# Patient Record
Sex: Female | Born: 1985 | Race: White | Hispanic: No | Marital: Married | Smoking: Never smoker
Health system: Southern US, Community
[De-identification: ages and names within clinical notes are randomized; demographics above are authoritative.]

## PROBLEM LIST (undated history)

## (undated) DIAGNOSIS — J45909 Unspecified asthma, uncomplicated: Secondary | ICD-10-CM

## (undated) HISTORY — DX: Unspecified asthma, uncomplicated: J45.909

---

## 2017-04-10 DIAGNOSIS — R490 Dysphonia: Secondary | ICD-10-CM | POA: Insufficient documentation

## 2017-04-10 DIAGNOSIS — J019 Acute sinusitis, unspecified: Secondary | ICD-10-CM | POA: Insufficient documentation

## 2017-05-07 DIAGNOSIS — K219 Gastro-esophageal reflux disease without esophagitis: Secondary | ICD-10-CM | POA: Insufficient documentation

## 2017-11-12 ENCOUNTER — Ambulatory Visit (INDEPENDENT_AMBULATORY_CARE_PROVIDER_SITE_OTHER): Payer: 59 | Admitting: Allergy and Immunology

## 2017-11-12 ENCOUNTER — Encounter: Payer: Self-pay | Admitting: Allergy and Immunology

## 2017-11-12 VITALS — BP 120/78 | HR 96 | Temp 97.9°F | Resp 20 | Ht 64.57 in | Wt 288.8 lb

## 2017-11-12 DIAGNOSIS — J4540 Moderate persistent asthma, uncomplicated: Secondary | ICD-10-CM | POA: Diagnosis not present

## 2017-11-12 DIAGNOSIS — H1010 Acute atopic conjunctivitis, unspecified eye: Secondary | ICD-10-CM | POA: Insufficient documentation

## 2017-11-12 DIAGNOSIS — H1013 Acute atopic conjunctivitis, bilateral: Secondary | ICD-10-CM

## 2017-11-12 DIAGNOSIS — J302 Other seasonal allergic rhinitis: Secondary | ICD-10-CM | POA: Insufficient documentation

## 2017-11-12 DIAGNOSIS — J3089 Other allergic rhinitis: Secondary | ICD-10-CM

## 2017-11-12 MED ORDER — OLOPATADINE HCL 0.2 % OP SOLN: 1.0000 [drp] | Freq: Every day | OPHTHALMIC | 5 refills | Status: DC | PRN

## 2017-11-12 MED ORDER — ALBUTEROL SULFATE HFA 108 (90 BASE) MCG/ACT IN AERS: 2.0000 | INHALATION_SPRAY | Freq: Four times a day (QID) | RESPIRATORY_TRACT | 2 refills | Status: DC | PRN

## 2017-11-12 MED ORDER — FLUTICASONE-SALMETEROL 230-21 MCG/ACT IN AERO: 2.0000 | INHALATION_SPRAY | Freq: Two times a day (BID) | RESPIRATORY_TRACT | 5 refills | Status: DC

## 2017-11-12 MED ORDER — AZELASTINE HCL 0.15 % NA SOLN: 1.0000 | Freq: Two times a day (BID) | NASAL | 5 refills | Status: DC | PRN

## 2017-11-12 MED ORDER — TIOTROPIUM BROMIDE MONOHYDRATE 1.25 MCG/ACT IN AERS: 2.0000 | INHALATION_SPRAY | Freq: Every day | RESPIRATORY_TRACT | 5 refills | Status: DC

## 2017-11-12 MED ORDER — CARBINOXAMINE MALEATE 4 MG PO TABS: 4.0000 mg | ORAL_TABLET | Freq: Three times a day (TID) | ORAL | 3 refills | Status: DC | PRN

## 2017-11-12 NOTE — Assessment & Plan Note (Signed)
   Aeroallergen avoidance measures have been discussed and provided in written form.  A prescription has been provided for carbinoxamine, 4 mg every 6-8 hours if needed.  A prescription has been provided for azelastine nasal spray, 1-2 sprays per nostril 2 times daily as needed. Proper nasal spray technique has been discussed and demonstrated.   Nasal saline spray (i.e., Simply Saline) or nasal saline lavage (i.e., NeilMed) is recommended as needed and prior to medicated nasal sprays.  If allergen avoidance measures and medications fail to adequately relieve symptoms, aeroallergen immunotherapy will be considered.

## 2017-11-12 NOTE — Progress Notes (Signed)
New Patient Note  RE: Deborah Owen MRN: [id number] DOB: 1985-05-12 Date of Office Visit: 11/12/2017  Referring provider: Charlesetta Shanks, MD Primary care provider: Darrow Bussing, MD  Chief Complaint: Asthma and Allergic Rhinitis    History of present illness: Deborah Owen is a 32 y.o. female seen today in consultation requested by Charlesetta Shanks, MD.  She reports that she moved from [place] to [place] when she was 32 years old.  When she was 32 years old she was diagnosed with asthma.  Over the past several years, her asthma has been controlled with Advair 100/50, 1 inhalation twice daily.  However, she had an asthma exacerbation in July requiring evaluation and treatment at the local urgent care.  She was given a steroid injection.  She returned to urgent care in August with another asthma exacerbation and was given a prednisone taper and a prescription for Advair 250/50, 1 inhalation twice daily.  She reports that she was on montelukast in the past but discontinued for unclear reasons.  Her asthma symptoms consist of coughing, dyspnea, and wheezing.  She currently requires albuterol rescue once daily on average. She also experiences nasal congestion, thick postnasal drainage, throat clearing, occasional sinus pressure, and occasional ocular pruritus.  No significant seasonal symptom variation has been noted nor have specific environmental triggers been identified.  She has attempted to control these symptoms with over-the-counter antihistamines without perceived benefit.  Her upper and lower respiratory symptoms are triggered by smoke, perfume, cats, and dogs.  Assessment and plan: Moderate persistent asthma Currently with suboptimal control.  We will step up therapy at this time.  A prescription has been provided for Advair 230-21 g HFA, 2 inhalations twice daily.  To maximize pulmonary deposition, a spacer has been provided along with instructions for its proper administration with an  HFA inhaler.  A prescription has been provided for Spiriva Respimat 1.25 g, 2 inhalations daily.  Continue albuterol HFA, 1 to 2 inhalations every 4-6 hours as needed.  Subjective and objective measures of pulmonary function will be followed and the treatment plan will be adjusted accordingly.  Perennial and seasonal allergic rhinitis  Aeroallergen avoidance measures have been discussed and provided in written form.  A prescription has been provided for carbinoxamine, 4 mg every 6-8 hours if needed.  A prescription has been provided for azelastine nasal spray, 1-2 sprays per nostril 2 times daily as needed. Proper nasal spray technique has been discussed and demonstrated.   Nasal saline spray (i.e., Simply Saline) or nasal saline lavage (i.e., NeilMed) is recommended as needed and prior to medicated nasal sprays.  If allergen avoidance measures and medications fail to adequately relieve symptoms, aeroallergen immunotherapy will be considered.  Allergic conjunctivitis  Treatment plan as outlined above for allergic rhinitis.  A prescription has been provided for Pataday, one drop per eye daily as needed.  I have also recommended eye lubricant drops (i.e., Natural Tears) as needed.   Meds ordered this encounter  Medications  . albuterol (PROVENTIL HFA;VENTOLIN HFA) 108 (90 Base) MCG/ACT inhaler    Sig: Inhale 2 puffs into the lungs every 6 (six) hours as needed for wheezing or shortness of breath.    Dispense:  18 g    Refill:  2  . fluticasone-salmeterol (ADVAIR HFA) 230-21 MCG/ACT inhaler    Sig: Inhale 2 puffs into the lungs 2 (two) times daily.    Dispense:  12 g    Refill:  5  . Tiotropium Bromide Monohydrate (SPIRIVA RESPIMAT) 1.25  MCG/ACT AERS    Sig: Inhale 2 puffs into the lungs daily.    Dispense:  4 g    Refill:  5  . Carbinoxamine Maleate 4 MG TABS    Sig: Take 1 tablet (4 mg total) by mouth every 8 (eight) hours as needed.    Dispense:  30 each    Refill:  3    . Azelastine HCl 0.15 % SOLN    Sig: Place 1-2 sprays into both nostrils 2 (two) times daily as needed.    Dispense:  30 mL    Refill:  5  . Olopatadine HCl (PATADAY) 0.2 % SOLN    Sig: Place 1 drop into both eyes daily as needed.    Dispense:  1 Bottle    Refill:  5    Diagnostics: Spirometry: FVC was 3.65 L and FEV1 was 3.43 L with 190 mL postbronchodilator improvement.  This study was performed while the patient was asymptomatic.  Please see scanned spirometry results for details. Environmental skin testing: Positive to grass pollen, dog epithelia, and cat hair.    Physical examination: Blood pressure 120/78, pulse 96, temperature 97.9 F (36.6 C), temperature source Oral, resp. rate 20, height 5' 4.57" (1.64 m), weight 288 lb 12.8 oz (131 kg), SpO2 97 %.  General: Alert, interactive, in no acute distress. HEENT: TMs pearly gray, turbinates moderately edematous with thick discharge, post-pharynx moderately erythematous. Neck: Supple without lymphadenopathy. Lungs: Clear to auscultation without wheezing, rhonchi or rales. CV: Normal S1, S2 without murmurs. Abdomen: Nondistended, nontender. Skin: Warm and dry, without lesions or rashes. Extremities:  No clubbing, cyanosis or edema. Neuro:   Grossly intact.  Review of systems:  Review of systems negative except as noted in HPI / PMHx or noted below: Review of Systems  Constitutional: Negative.   HENT: Negative.   Eyes: Negative.   Respiratory: Negative.   Cardiovascular: Negative.   Gastrointestinal: Negative.   Genitourinary: Negative.   Musculoskeletal: Negative.   Skin: Negative.   Neurological: Negative.   Endo/Heme/Allergies: Negative.   Psychiatric/Behavioral: Negative.     Past medical history:  Past Medical History:  Diagnosis Date  . Asthma     Past surgical history:  History reviewed. No pertinent surgical history.  Family history: Family History  Problem Relation Age of Onset  . Allergic rhinitis  Brother   . Asthma Neg Hx   . Eczema Neg Hx   . Urticaria Neg Hx     Social history: Social History   Socioeconomic History  . Marital status: Married    Spouse name: Not on file  . Number of children: Not on file  . Years of education: Not on file  . Highest education level: Not on file  Occupational History  . Not on file  Social Needs  . Financial resource strain: Not on file  . Food insecurity:    Worry: Not on file    Inability: Not on file  . Transportation needs:    Medical: Not on file    Non-medical: Not on file  Tobacco Use  . Smoking status: Never Smoker  . Smokeless tobacco: Never Used  Substance and Sexual Activity  . Alcohol use: Never    Frequency: Never  . Drug use: Not on file  . Sexual activity: Not on file  Lifestyle  . Physical activity:    Days per week: Not on file    Minutes per session: Not on file  . Stress: Not on file  Relationships  .  Social connections:    Talks on phone: Not on file    Gets together: Not on file    Attends religious service: Not on file    Active member of club or organization: Not on file    Attends meetings of clubs or organizations: Not on file    Relationship status: Not on file  . Intimate partner violence:    Fear of current or ex partner: Not on file    Emotionally abused: Not on file    Physically abused: Not on file    Forced sexual activity: Not on file  Other Topics Concern  . Not on file  Social History Narrative  . Not on file   Environmental History: The patient lives in a 30 year old apartment with carpeting throughout, gas heat, and central air.  There is a cat in the home which has access to her bedroom.  There is no known mold/water damage in the home.  She is a non-smoker.  Allergies as of 11/12/2017   No Known Allergies     Medication List        Accurate as of 11/12/17  6:35 PM. Always use your most recent med list.          albuterol 108 (90 Base) MCG/ACT inhaler Commonly known  as:  PROVENTIL HFA;VENTOLIN HFA Inhale 2 puffs into the lungs every 6 (six) hours as needed for wheezing or shortness of breath.   Azelastine HCl 0.15 % Soln Place 1-2 sprays into both nostrils 2 (two) times daily as needed.   Carbinoxamine Maleate 4 MG Tabs Take 1 tablet (4 mg total) by mouth every 8 (eight) hours as needed.   Fluticasone-Salmeterol 250-50 MCG/DOSE Aepb Commonly known as:  ADVAIR Inhale 2 puffs into the lungs 2 (two) times daily as needed.   fluticasone-salmeterol 230-21 MCG/ACT inhaler Commonly known as:  ADVAIR HFA Inhale 2 puffs into the lungs 2 (two) times daily.   Olopatadine HCl 0.2 % Soln Place 1 drop into both eyes daily as needed.   Tiotropium Bromide Monohydrate 1.25 MCG/ACT Aers Inhale 2 puffs into the lungs daily.       Known medication allergies: No Known Allergies  I appreciate the opportunity to take part in Raymond's care. Please do not hesitate to contact me with questions.  Sincerely,   R. Jorene Guest, MD

## 2017-11-12 NOTE — Assessment & Plan Note (Signed)
   Treatment plan as outlined above for allergic rhinitis.  A prescription has been provided for Pataday, one drop per eye daily as needed.  I have also recommended eye lubricant drops (i.e., Natural Tears) as needed. 

## 2017-11-12 NOTE — Patient Instructions (Addendum)
Moderate persistent asthma Currently with suboptimal control.  We will step up therapy at this time.  A prescription has been provided for Advair 230-21 g HFA, 2 inhalations twice daily.  To maximize pulmonary deposition, a spacer has been provided along with instructions for its proper administration with an HFA inhaler.  A prescription has been provided for Spiriva Respimat 1.25 g, 2 inhalations daily.  Continue albuterol HFA, 1 to 2 inhalations every 4-6 hours as needed.  Subjective and objective measures of pulmonary function will be followed and the treatment plan will be adjusted accordingly.  Perennial and seasonal allergic rhinitis  Aeroallergen avoidance measures have been discussed and provided in written form.  A prescription has been provided for carbinoxamine, 4 mg every 6-8 hours if needed.  A prescription has been provided for azelastine nasal spray, 1-2 sprays per nostril 2 times daily as needed. Proper nasal spray technique has been discussed and demonstrated.   Nasal saline spray (i.e., Simply Saline) or nasal saline lavage (i.e., NeilMed) is recommended as needed and prior to medicated nasal sprays.  If allergen avoidance measures and medications fail to adequately relieve symptoms, aeroallergen immunotherapy will be considered.  Allergic conjunctivitis  Treatment plan as outlined above for allergic rhinitis.  A prescription has been provided for Pataday, one drop per eye daily as needed.  I have also recommended eye lubricant drops (i.e., Natural Tears) as needed.   Return in about 3 months (around 02/12/2018), or if symptoms worsen or fail to improve.  Control of Dog or Cat Allergen  Avoidance is the best way to manage a dog or cat allergy. If you have a dog or cat and are allergic to dog or cats, consider removing the dog or cat from the home. If you have a dog or cat but don't want to find it a new home, or if your family wants a pet even though someone  in the household is allergic, here are some strategies that may help keep symptoms at bay:  1. Keep the pet out of your bedroom and restrict it to only a few rooms. Be advised that keeping the dog or cat in only one room will not limit the allergens to that room. 2. Don't pet, hug or kiss the dog or cat; if you do, wash your hands with soap and water. 3. High-efficiency particulate air (HEPA) cleaners run continuously in a bedroom or living room can reduce allergen levels over time. 4. Place electrostatic material sheet in the air inlet vent in the bedroom. 5. Regular use of a high-efficiency vacuum cleaner or a central vacuum can reduce allergen levels. 6. Giving your dog or cat a bath at least once a week can reduce airborne allergen.  Control of Mold Allergen  Mold and fungi can grow on a variety of surfaces provided certain temperature and moisture conditions exist.  Outdoor molds grow on plants, decaying vegetation and soil.  The major outdoor mold, Alternaria and Cladosporium, are found in very high numbers during hot and dry conditions.  Generally, a late Summer - Fall peak is seen for common outdoor fungal spores.  Rain will temporarily lower outdoor mold spore count, but counts rise rapidly when the rainy period ends.  The most important indoor molds are Aspergillus and Penicillium.  Dark, humid and poorly ventilated basements are ideal sites for mold growth.  The next most common sites of mold growth are the bathroom and the kitchen.  Outdoor [employer] 1. Use air conditioning and keep windows  closed 2. Avoid exposure to decaying vegetation. 3. Avoid leaf raking. 4. Avoid grain handling. 5. Consider wearing a face mask if working in moldy areas.  Indoor Mold Control 1. Maintain humidity below 50%. 2. Clean washable surfaces with 5% bleach solution. 3. Remove sources e.g. Contaminated carpets.

## 2017-11-12 NOTE — Assessment & Plan Note (Signed)
Currently with suboptimal control.  We will step up therapy at this time.  A prescription has been provided for Advair 230-21 g HFA, 2 inhalations twice daily.  To maximize pulmonary deposition, a spacer has been provided along with instructions for its proper administration with an HFA inhaler.  A prescription has been provided for Spiriva Respimat 1.25 g, 2 inhalations daily.  Continue albuterol HFA, 1 to 2 inhalations every 4-6 hours as needed.  Subjective and objective measures of pulmonary function will be followed and the treatment plan will be adjusted accordingly.

## 2018-02-17 ENCOUNTER — Ambulatory Visit (INDEPENDENT_AMBULATORY_CARE_PROVIDER_SITE_OTHER): Payer: 59 | Admitting: Family Medicine

## 2018-02-17 ENCOUNTER — Encounter: Payer: Self-pay | Admitting: Family Medicine

## 2018-02-17 VITALS — BP 130/82 | HR 80 | Temp 98.2°F | Resp 18 | Ht 64.0 in | Wt 295.6 lb

## 2018-02-17 DIAGNOSIS — J3089 Other allergic rhinitis: Secondary | ICD-10-CM | POA: Diagnosis not present

## 2018-02-17 DIAGNOSIS — J4540 Moderate persistent asthma, uncomplicated: Secondary | ICD-10-CM

## 2018-02-17 DIAGNOSIS — H1013 Acute atopic conjunctivitis, bilateral: Secondary | ICD-10-CM | POA: Diagnosis not present

## 2018-02-17 MED ORDER — CARBINOXAMINE MALEATE 4 MG PO TABS: ORAL_TABLET | ORAL | 3 refills | Status: DC

## 2018-02-17 NOTE — Patient Instructions (Addendum)
Moderate persistent asthma Continue Advair 230/21 -2 puffs twice a day with a spacer to prevent cough and wheeze Continue Spiriva 1.25 -2 puffs once a day to prevent cough and wheeze Continue Proventil inhaler 2 puffs every 4 hours as needed for cough or wheeze You may use Proventil 2 puffs 5-15 minutes before exercise to decrease cough and wheeze  Perennial and seasonal allergic rhinitis Continue carbinoxamine 4 mg but increase to two or three times a day to help with thick mucus Continue azelastine nasal spray as needed Consider nasal rinses once a day  Allergic conjunctivitis of both eyes Continue Allergen avoidance measures Pataday 1 drop in each eye once a day as needed for red or itchy eyes  Call us if this treatment plan is not working well for you  Follow up in 4 months or sooner if needed

## 2018-02-17 NOTE — Progress Notes (Addendum)
[address] [place] [address] Dept: [phone]  FOLLOW UP NOTE  Patient ID: Deborah Owen, female    DOB: 1985/07/02  Age: 33 y.o. MRN: [id number] Date of Office Visit: 02/17/2018  Assessment  Chief Complaint: Asthma (more under control) and Cough  HPI Deborah Owen is a 33 year old female who presents to the clinic for a follow up visit. She was last seen in this clinic on 11/12/2017 by Dr. Nunzio Cobbs for evaluation of asthma, allergic rhinitis, and allergic conjunctivitis. At that time, she started in Advair 230/21 2 puffs twice a day and carbinoxamine. At today's visit, she reports her asthma has been moderately well controlled with occasional shortness of breath and wheezing with activity. She reports a cough that always produces mucus that is mostly thick and clear. She continues Advair 230/21 2 puffs twice a day and she has been using Spiriva 1.25 1 puff in the morning and 1 puff at night due to a misunderstanding of prior instructions. She reports allergic rhinitis is improved, however, she continues to experience thick post nasal drainage as well as thin clear nasal drip. She is taking carbinoxamine as needed and has used azelastine once since her last visit with relief of symptoms. Her current medications are listed in the chart.    Drug Allergies:  No Known Allergies  Physical Exam: BP 130/82 (BP Location: Right Arm, Patient Position: Sitting, Cuff Size: Large)   Pulse 80   Temp 98.2 F (36.8 C) (Oral)   Resp 18   Ht 5\' 4"  (1.626 m)   Wt 295 lb 9.6 oz (134.1 kg)   SpO2 96%   BMI 50.74 kg/m    Physical Exam Vitals signs reviewed.  Constitutional:      Appearance: Normal appearance.  HENT:     Head: Normocephalic and atraumatic.     Right Ear: Tympanic membrane normal.     Left Ear: Tympanic membrane normal.     Nose:     Comments: Bilateral nares slightly erythematous with no nasal drainage. Pharynx slightly erythematous with no exudate. Ears normal. Eyes  normal.    Mouth/Throat:     Pharynx: Oropharynx is clear.  Eyes:     Conjunctiva/sclera: Conjunctivae normal.  Neck:     Musculoskeletal: Normal range of motion and neck supple.  Cardiovascular:     Rate and Rhythm: Normal rate and regular rhythm.     Heart sounds: Normal heart sounds. No murmur.  Pulmonary:     Effort: Pulmonary effort is normal.     Breath sounds: Normal breath sounds.     Comments: Lungs clear to auscultation Musculoskeletal: Normal range of motion.  Skin:    General: Skin is warm and dry.  Neurological:     Mental Status: She is alert and oriented to person, place, and time.  Psychiatric:        Mood and Affect: Mood normal.        Behavior: Behavior normal.        Thought Content: Thought content normal.        Judgment: Judgment normal.     Diagnostics: FVC 3.62, FEV1 3.32. Predicted FVC 3.80, predicted FEV1 3.20. Spirometry is within the normal range.  Assessment and Plan: 1. Moderate persistent asthma without complication   2. Perennial and seasonal allergic rhinitis   3. Allergic conjunctivitis of both eyes     Meds ordered this encounter  Medications  . Carbinoxamine Maleate 4 MG TABS    Sig: One tablet (4mg )  two or three times a day as needed.    Dispense:  60 each    Refill:  3    Patient Instructions  Moderate persistent asthma Continue Advair 230/21 -2 puffs twice a day with a spacer to prevent cough and wheeze Continue Spiriva 1.25 -2 puffs once a day to prevent cough and wheeze Continue Proventil inhaler 2 puffs every 4 hours as needed for cough or wheeze You may use Proventil 2 puffs 5-15 minutes before exercise to decrease cough and wheeze  Perennial and seasonal allergic rhinitis Continue carbinoxamine 4 mg but increase to two or three times a day to help with thick mucus Continue azelastine nasal spray as needed Consider nasal rinses once a day  Allergic conjunctivitis of both eyes Continue Allergen avoidance  measures Pataday 1 drop in each eye once a day as needed for red or itchy eyes  Call us if this treatment plan is not working well for you  Follow up in 4 months or sooner if needed   Return in about 4 months (around 06/18/2018), or if symptoms worsen or fail to improve.   Thank you for the opportunity to care for this patient.  Please do not hesitate to contact me with questions.  Thermon LeylandAnne Abhijay Morriss, FNP [facility]   _________________________________________________  I have provided oversight concerning [facility]'s evaluation and treatment of this patient's health issues addressed during today's encounter.  I agree with the assessment and therapeutic plan as outlined in the note.   Signed,   R Jorene Guestarter Bobbitt, MD

## 2018-04-26 ENCOUNTER — Telehealth: Payer: Self-pay

## 2018-04-26 NOTE — Telephone Encounter (Signed)
Pt would like a note stating because of her asthma she should be able to participate in early shopping hours for elderly and high risk people. If it is ok what should it state.  pts # [phone]

## 2018-04-26 NOTE — Telephone Encounter (Signed)
Informed pt per johnette we are not witting notes to be excused from anything and that she should consider online grocery shopping pt stated her understanding

## 2018-04-26 NOTE — Telephone Encounter (Signed)
Please say that due to severe asthma she is allowed to shop during hours that have been dedicated to elderly and high risk patients. Thank you

## 2018-05-18 ENCOUNTER — Telehealth: Payer: Self-pay | Admitting: Allergy and Immunology

## 2018-05-18 ENCOUNTER — Other Ambulatory Visit: Payer: Self-pay

## 2018-05-18 MED ORDER — ALBUTEROL SULFATE HFA 108 (90 BASE) MCG/ACT IN AERS: 2.0000 | INHALATION_SPRAY | Freq: Four times a day (QID) | RESPIRATORY_TRACT | 2 refills | Status: DC | PRN

## 2018-05-18 MED ORDER — FLUTICASONE-SALMETEROL 230-21 MCG/ACT IN AERO: 2.0000 | INHALATION_SPRAY | Freq: Two times a day (BID) | RESPIRATORY_TRACT | 5 refills | Status: DC

## 2018-05-18 NOTE — Telephone Encounter (Signed)
Sent in refill

## 2018-05-18 NOTE — Telephone Encounter (Signed)
PT called in to reorder her inhalers & Albuterol. Pharmacy is the same.

## 2018-06-14 ENCOUNTER — Other Ambulatory Visit: Payer: Self-pay | Admitting: *Deleted

## 2018-06-14 NOTE — Telephone Encounter (Signed)
Denied spirva respimat- pt last seen 11/2017 and was to return 02/2018.

## 2018-06-15 ENCOUNTER — Other Ambulatory Visit: Payer: Self-pay

## 2018-06-15 MED ORDER — TIOTROPIUM BROMIDE MONOHYDRATE 1.25 MCG/ACT IN AERS: 2.0000 | INHALATION_SPRAY | Freq: Every day | RESPIRATORY_TRACT | 1 refills | Status: DC

## 2018-06-23 ENCOUNTER — Ambulatory Visit: Payer: 59 | Admitting: Allergy and Immunology

## 2018-08-19 ENCOUNTER — Ambulatory Visit: Payer: 59 | Admitting: Allergy and Immunology

## 2018-08-26 ENCOUNTER — Other Ambulatory Visit: Payer: Self-pay | Admitting: Allergy and Immunology

## 2018-08-26 NOTE — Telephone Encounter (Signed)
GAVE 1 COURTESY REFILL OF ALBUTEROL. PT NEEDS OV.

## 2018-08-27 ENCOUNTER — Other Ambulatory Visit: Payer: Self-pay | Admitting: Allergy and Immunology

## 2018-10-14 ENCOUNTER — Other Ambulatory Visit: Payer: Self-pay | Admitting: *Deleted

## 2018-10-18 ENCOUNTER — Other Ambulatory Visit: Payer: Self-pay | Admitting: *Deleted

## 2018-11-01 ENCOUNTER — Other Ambulatory Visit: Payer: Self-pay | Admitting: *Deleted

## 2018-11-03 ENCOUNTER — Other Ambulatory Visit: Payer: Self-pay

## 2018-11-10 ENCOUNTER — Other Ambulatory Visit: Payer: Self-pay | Admitting: Allergy and Immunology

## 2018-11-10 DIAGNOSIS — J4540 Moderate persistent asthma, uncomplicated: Secondary | ICD-10-CM

## 2018-11-25 ENCOUNTER — Other Ambulatory Visit: Payer: Self-pay | Admitting: *Deleted

## 2018-11-25 MED ORDER — ALBUTEROL SULFATE HFA 108 (90 BASE) MCG/ACT IN AERS: 2.0000 | INHALATION_SPRAY | RESPIRATORY_TRACT | 0 refills | Status: DC | PRN

## 2018-11-25 MED ORDER — SPIRIVA RESPIMAT 1.25 MCG/ACT IN AERS: 2.0000 | INHALATION_SPRAY | Freq: Every day | RESPIRATORY_TRACT | 0 refills | Status: DC

## 2018-11-29 ENCOUNTER — Ambulatory Visit (INDEPENDENT_AMBULATORY_CARE_PROVIDER_SITE_OTHER): Payer: 59 | Admitting: Family Medicine

## 2018-11-29 ENCOUNTER — Other Ambulatory Visit: Payer: Self-pay

## 2018-11-29 ENCOUNTER — Encounter: Payer: Self-pay | Admitting: Family Medicine

## 2018-11-29 DIAGNOSIS — J3089 Other allergic rhinitis: Secondary | ICD-10-CM | POA: Diagnosis not present

## 2018-11-29 DIAGNOSIS — J4540 Moderate persistent asthma, uncomplicated: Secondary | ICD-10-CM | POA: Diagnosis not present

## 2018-11-29 DIAGNOSIS — H1013 Acute atopic conjunctivitis, bilateral: Secondary | ICD-10-CM

## 2018-11-29 MED ORDER — CARBINOXAMINE MALEATE 4 MG PO TABS: ORAL_TABLET | ORAL | 3 refills | Status: DC

## 2018-11-29 MED ORDER — AZELASTINE HCL 0.15 % NA SOLN: 1.0000 | Freq: Two times a day (BID) | NASAL | 5 refills | Status: DC | PRN

## 2018-11-29 MED ORDER — SPIRIVA RESPIMAT 1.25 MCG/ACT IN AERS: 2.0000 | INHALATION_SPRAY | Freq: Every day | RESPIRATORY_TRACT | 5 refills | Status: DC

## 2018-11-29 MED ORDER — FLUTICASONE-SALMETEROL 230-21 MCG/ACT IN AERO: 2.0000 | INHALATION_SPRAY | Freq: Two times a day (BID) | RESPIRATORY_TRACT | 5 refills | Status: DC

## 2018-11-29 MED ORDER — MONTELUKAST SODIUM 10 MG PO TABS: 10.0000 mg | ORAL_TABLET | Freq: Every day | ORAL | 5 refills | Status: DC

## 2018-11-29 MED ORDER — ALBUTEROL SULFATE HFA 108 (90 BASE) MCG/ACT IN AERS: 2.0000 | INHALATION_SPRAY | RESPIRATORY_TRACT | 2 refills | Status: DC | PRN

## 2018-11-29 NOTE — Patient Instructions (Addendum)
Moderate persistent asthma Begin montelukast 10 mg once a day to prevent cough or wheeze Continue Advair 230/21 -2 puffs twice a day with a spacer to prevent cough and wheeze Continue Spiriva 1.25 -2 puffs once a day to prevent cough and wheeze Continue Proventil inhaler 2 puffs every 4 hours as needed for cough or wheeze You may use Proventil 2 puffs 5-15 minutes before exercise to decrease cough and wheeze  Perennial and seasonal allergic rhinitis Continue carbinoxamine 4 mg but increase to two or three times a day to help with thick mucus Continue azelastine nasal spray as needed Consider nasal rinses once a day  Allergic conjunctivitis of both eyes Continue Allergen avoidance measures Pataday 1 drop in each eye once a day as needed for red or itchy eyes  Call us if this treatment plan is not working well for you  Follow up in the clinic in 6 months or sooner if needed

## 2018-11-29 NOTE — Progress Notes (Addendum)
RE: Deborah Owen MRN: [id number] DOB: 1985/04/23 Date of Telemedicine Visit: 11/29/2018  Referring provider: Lujean Amel, MD Primary care provider: Lujean Amel, MD  Chief Complaint: Asthma   Telemedicine Follow Up Visit via Telephone: I connected with Deborah Owen for a follow up on 11/29/18 by telephone and verified that I am speaking with the correct person using two identifiers.   I discussed the limitations, risks, security and privacy concerns of performing an evaluation and management service by telephone and the availability of in person appointments. I also discussed with the patient that there may be a patient responsible charge related to this service. The patient expressed understanding and agreed to proceed.  Patient is at home Provider is at the office.  Visit start time: 1:50 Visit end time: 2:18 Insurance consent/check in by: Jan Fireman Medical consent and medical assistant/nurse: Katherina Right  History of Present Illness: She is a 33 y.o. female, who is being followed for asthma, allergic rhinitis, and allergic conjunctivitis. Her previous allergy office visit was on 02/17/2018 with Gareth Morgan, [place]. At today's visit, she reports her asthma has improved since her last visit to this clinic. She reports some shortness of breath and wheeze with vigorous activity. She continues Advair 230-2 puffs twice a day with a spacer, Spiriva 1.25 mcg-2 puffs once a day, and albuterol 2 times a week for rescue. She continues albuterol 2 puffs before activities. Allergic rhinitis is reported as well controlled with carbinoxamine and Flonase as needed. Allergic conjunctivitis is reported as well controlled with Pataday as needed. Her current medications are lsited in the chart.   Assessment and Plan: Moderate persistent asthma Begin montelukast 10 mg once a day to prevent cough or wheeze Continue Advair 230/21 -2 puffs twice a day with a spacer to prevent cough and wheeze Continue Spiriva  1.25 -2 puffs once a day to prevent cough and wheeze Continue Proventil inhaler 2 puffs every 4 hours as needed for cough or wheeze You may use Proventil 2 puffs 5-15 minutes before exercise to decrease cough and wheeze  Perennial and seasonal allergic rhinitis Continue carbinoxamine 4 mg but increase to two or three times a day to help with thick mucus Continue azelastine nasal spray as needed Consider nasal rinses once a day  Allergic conjunctivitis of both eyes Continue Allergen avoidance measures Pataday 1 drop in each eye once a day as needed for red or itchy eyes  Call us if this treatment plan is not working well for you  Follow up in the clinic in 6 months or sooner if needed  Return in about 6 months (around 05/30/2019), or if symptoms worsen or fail to improve.  Meds ordered this encounter  Medications  . montelukast (SINGULAIR) 10 MG tablet    Sig: Take 1 tablet (10 mg total) by mouth at bedtime.    Dispense:  30 tablet    Refill:  5  . albuterol (VENTOLIN HFA) 108 (90 Base) MCG/ACT inhaler    Sig: Inhale 2 puffs into the lungs every 4 (four) hours as needed for wheezing or shortness of breath.    Dispense:  6.7 g    Refill:  2    Place on hold- pt will call  . fluticasone-salmeterol (ADVAIR HFA) 230-21 MCG/ACT inhaler    Sig: Inhale 2 puffs into the lungs 2 (two) times daily.    Dispense:  12 g    Refill:  5  . Tiotropium Bromide Monohydrate (SPIRIVA RESPIMAT) 1.25 MCG/ACT AERS  Sig: Inhale 2 puffs into the lungs daily.    Dispense:  4 g    Refill:  5    Place on hold- pt will call  . Carbinoxamine Maleate 4 MG TABS    Sig: One tablet (4mg ) two or three times a day as needed.    Dispense:  180 tablet    Refill:  3  . Azelastine HCl 0.15 % SOLN    Sig: Place 1-2 sprays into both nostrils 2 (two) times daily as needed.    Dispense:  30 mL    Refill:  5    Medication List:  Current Outpatient Medications  Medication Sig Dispense Refill  . albuterol  (VENTOLIN HFA) 108 (90 Base) MCG/ACT inhaler Inhale 2 puffs into the lungs every 4 (four) hours as needed for wheezing or shortness of breath. 6.7 g 2  . Azelastine HCl 0.15 % SOLN Place 1-2 sprays into both nostrils 2 (two) times daily as needed. 30 mL 5  . Carbinoxamine Maleate 4 MG TABS One tablet (4mg ) two or three times a day as needed. 180 tablet 3  . fluticasone-salmeterol (ADVAIR HFA) 230-21 MCG/ACT inhaler Inhale 2 puffs into the lungs 2 (two) times daily. 12 g 5  . Tiotropium Bromide Monohydrate (SPIRIVA RESPIMAT) 1.25 MCG/ACT AERS Inhale 2 puffs into the lungs daily. 4 g 5  . montelukast (SINGULAIR) 10 MG tablet Take 1 tablet (10 mg total) by mouth at bedtime. 30 tablet 5  . Olopatadine HCl (PATADAY) 0.2 % SOLN Place 1 drop into both eyes daily as needed. (Patient not taking: Reported on 11/29/2018) 1 Bottle 5   No current facility-administered medications for this visit.    Allergies: No Known Allergies I reviewed her past medical history, social history, family history, and environmental history and no significant changes have been reported from previous visit on 02/17/2018.  Objective: Physical Exam Not obtained as encounter was done via telephone.   Previous notes and tests were reviewed.  I discussed the assessment and treatment plan with the patient. The patient was provided an opportunity to ask questions and all were answered. The patient agreed with the plan and demonstrated an understanding of the instructions.   The patient was advised to call back or seek an in-person evaluation if the symptoms worsen or if the condition fails to improve as anticipated.  I provided 28 minutes of non-face-to-face time during this encounter.  It was my pleasure to participate in Deborah Owen's care today. Please feel free to contact me with any questions or concerns.   Sincerely,  02/19/2018, FNP   I have provided oversight concerning Monday' evaluation and treatment of this  patient's health issues addressed during today's encounter. I agree with the assessment and therapeutic plan as outlined in the note.   Thank you for the opportunity to care for this patient.  Please do not hesitate to contact me with questions.  Thermon Leyland, M.D.  [facility] [address] [place], [address] [place] [phone]

## 2019-05-10 ENCOUNTER — Telehealth: Payer: Self-pay | Admitting: Family Medicine

## 2019-05-10 MED ORDER — SPIRIVA RESPIMAT 1.25 MCG/ACT IN AERS: 2.0000 | INHALATION_SPRAY | Freq: Every day | RESPIRATORY_TRACT | 0 refills | Status: DC

## 2019-05-10 MED ORDER — ALBUTEROL SULFATE HFA 108 (90 BASE) MCG/ACT IN AERS: 2.0000 | INHALATION_SPRAY | RESPIRATORY_TRACT | 0 refills | Status: DC | PRN

## 2019-05-10 NOTE — Telephone Encounter (Signed)
Called pt to reschedule, Thurston Hole is out on original appt. Date. Rescheduled appt. to May 20 at 9 a.m. Pt asked for refills of Spiriva and Albuterol. Advised patient needs to keep appt. Please send 30 day supply.

## 2019-05-10 NOTE — Telephone Encounter (Signed)
Sent in refills 

## 2019-05-30 ENCOUNTER — Ambulatory Visit: Payer: Self-pay | Admitting: Family Medicine

## 2019-06-23 ENCOUNTER — Ambulatory Visit (INDEPENDENT_AMBULATORY_CARE_PROVIDER_SITE_OTHER): Payer: 59 | Admitting: Family Medicine

## 2019-06-23 ENCOUNTER — Encounter: Payer: Self-pay | Admitting: Family Medicine

## 2019-06-23 VITALS — BP 122/74 | HR 82 | Temp 97.9°F | Resp 18 | Ht 64.5 in | Wt 303.0 lb

## 2019-06-23 DIAGNOSIS — H1013 Acute atopic conjunctivitis, bilateral: Secondary | ICD-10-CM

## 2019-06-23 DIAGNOSIS — J4540 Moderate persistent asthma, uncomplicated: Secondary | ICD-10-CM | POA: Diagnosis not present

## 2019-06-23 DIAGNOSIS — K219 Gastro-esophageal reflux disease without esophagitis: Secondary | ICD-10-CM

## 2019-06-23 DIAGNOSIS — J3089 Other allergic rhinitis: Secondary | ICD-10-CM | POA: Diagnosis not present

## 2019-06-23 MED ORDER — ALBUTEROL SULFATE HFA 108 (90 BASE) MCG/ACT IN AERS: 2.0000 | INHALATION_SPRAY | RESPIRATORY_TRACT | 1 refills | Status: DC | PRN

## 2019-06-23 MED ORDER — CARBINOXAMINE MALEATE 4 MG PO TABS: ORAL_TABLET | ORAL | 5 refills | Status: DC

## 2019-06-23 MED ORDER — AZELASTINE HCL 0.15 % NA SOLN: 1.0000 | Freq: Two times a day (BID) | NASAL | 5 refills | Status: DC | PRN

## 2019-06-23 MED ORDER — SPIRIVA RESPIMAT 1.25 MCG/ACT IN AERS: 2.0000 | INHALATION_SPRAY | Freq: Every day | RESPIRATORY_TRACT | 5 refills | Status: DC

## 2019-06-23 MED ORDER — FLUTICASONE-SALMETEROL 230-21 MCG/ACT IN AERO: 2.0000 | INHALATION_SPRAY | Freq: Two times a day (BID) | RESPIRATORY_TRACT | 5 refills | Status: DC

## 2019-06-23 NOTE — Progress Notes (Addendum)
[address] [place] [address] Dept: [phone]  FOLLOW UP NOTE  Patient ID: Deborah Owen, female    DOB: 11-19-1985  Age: 34 y.o. MRN: [id number] Date of Office Visit: 06/23/2019  Assessment  Chief Complaint: Cough  HPI Deborah Owen is a 34 year old female who presents to the clinic for follow-up visit.  She was last seen in this clinic on 11/19/2019 for evaluation of asthma, allergic rhinitis, and allergic conjunctivitis.  At today's visit, she reports her asthma has been well controlled with no shortness of breath with activity or rest.  She reports occasional wheeze when she begins coughing.  She continues montelukast, Advair 230-2 puffs twice a day with a spacer, and Spiriva 2 puffs once a day.  She uses albuterol before exercise.  She reports interest in stopping montelukast as this has not improved her symptoms.  Allergic rhinitis is reported as not well controlled thick postnasal drainage, clear mucus producing cough occurring randomly in the daytime and nighttime, and cough occurring after strong odors.  She continues carbinoxamine as needed, azelastine as needed, and is not currently using nasal saline rinses.  Reflux is reported as moderately well controlled and occurring after eating foods that are greasy for which she takes Tums with relief of symptoms.  Her current medications are listed in the chart.   Drug Allergies:  No Known Allergies  Physical Exam: BP 122/74   Pulse 82   Temp 97.9 F (36.6 C) (Oral)   Resp 18   Ht 5' 4.5" (1.638 m)   Wt (!) 303 lb (137.4 kg)   SpO2 98%   BMI 51.21 kg/m    Physical Exam Vitals reviewed.  Constitutional:      Appearance: Normal appearance.  HENT:     Head: Normocephalic and atraumatic.     Right Ear: Tympanic membrane normal.     Left Ear: Tympanic membrane normal.     Nose:     Comments: Bilateral nares slightly erythematous with clear nasal drainage.  Pharynx slightly erythematous with no exudate.  Ears normal.   Eyes normal.    Mouth/Throat:     Pharynx: Oropharynx is clear.  Eyes:     Conjunctiva/sclera: Conjunctivae normal.  Cardiovascular:     Rate and Rhythm: Normal rate and regular rhythm.     Heart sounds: Normal heart sounds. No murmur.  Pulmonary:     Effort: Pulmonary effort is normal.     Breath sounds: Normal breath sounds.     Comments: Lungs clear to auscultation Musculoskeletal:        General: Normal range of motion.     Cervical back: Normal range of motion and neck supple.  Skin:    General: Skin is warm and dry.  Neurological:     Mental Status: She is alert and oriented to person, place, and time.  Psychiatric:        Mood and Affect: Mood normal.        Behavior: Behavior normal.        Thought Content: Thought content normal.        Judgment: Judgment normal.     Diagnostics: FVC 3.81, FEV1 3.36.  Predicted FVC 3.88, predicted FEV1 one 3.25.  Spirometry indicates normal ventilatory function.  Assessment and Plan: 1. Moderate persistent asthma without complication   2. Perennial and seasonal allergic rhinitis   3. Allergic conjunctivitis of both eyes   4. Gastroesophageal reflux disease, unspecified whether esophagitis present     Meds ordered this encounter  Medications  . albuterol (VENTOLIN HFA) 108 (90 Base) MCG/ACT inhaler    Sig: Inhale 2 puffs into the lungs every 4 (four) hours as needed for wheezing or shortness of breath.    Dispense:  18 g    Refill:  1  . Azelastine HCl 0.15 % SOLN    Sig: Place 1-2 sprays into both nostrils 2 (two) times daily as needed.    Dispense:  30 mL    Refill:  5  . fluticasone-salmeterol (ADVAIR HFA) 230-21 MCG/ACT inhaler    Sig: Inhale 2 puffs into the lungs 2 (two) times daily.    Dispense:  12 g    Refill:  5  . Carbinoxamine Maleate 4 MG TABS    Sig: One tablet (4mg ) two or three times a day as needed.    Dispense:  180 tablet    Refill:  5  . Tiotropium Bromide Monohydrate (SPIRIVA RESPIMAT) 1.25 MCG/ACT  AERS    Sig: Inhale 2 puffs into the lungs daily.    Dispense:  4 g    Refill:  5    Patient Instructions  Moderate persistent asthma Stop montelukast 10 mg as this did not improve your symptoms Continue Advair 230/21 -2 puffs twice a day with a spacer to prevent cough and wheeze Continue Spiriva 1.25 -2 puffs once a day to prevent cough and wheeze Continue Proventil inhaler 2 puffs every 4 hours as needed for cough or wheeze You may use Proventil 2 puffs 5-15 minutes before exercise to decrease cough and wheeze  Perennial and seasonal allergic rhinitis Begin Flonase or Nasocort 1-2 sprays in each nostril twice a day as needed for a stuffy nose Continue carbinoxamine 4 mg but increase to two or three times a day to help with thick mucus Continue azelastine nasal spray as needed for a runny nose Consider saline nasal rinses as needed for nasal symptoms. Use this before any medicated nasal sprays for best result For thick post nasal drainage, begin Mucinex [phone] mg twice a day If these medications do not improve your symptoms, consider allergen immunotherapy for long term control. If you are interested in this option, call the clinic and make an appointment for your first injection in about 2-3 weeks.  Allergic conjunctivitis of both eyes Continue Allergen avoidance measures Continue Pataday 1 drop in each eye once a day as needed for red or itchy eyes  Reflux Cut down chocolate and caffeine consumption Begin dietary and lifestyle modifications as listed below to control reflux. This may help reduce your cough Call if this treatment plan is not working well for you  Follow up in the clinic in 6 months or sooner if needed   Return in about 5 months (around 11/23/2019), or if symptoms worsen or fail to improve.    Thank you for the opportunity to care for this patient.  Please do not hesitate to contact me with questions.  11/25/2019, FNP [facility]  ________________________________________________  I have provided oversight concerning [facility]'s evaluation and treatment of this patient's health issues addressed during today's encounter.  I agree with the assessment and therapeutic plan as outlined in the note.   Signed,   R Thurston Hole, MD

## 2019-06-23 NOTE — Patient Instructions (Addendum)
Moderate persistent asthma Stop montelukast 10 mg as this did not improve your symptoms Continue Advair 230/21 -2 puffs twice a day with a spacer to prevent cough and wheeze Continue Spiriva 1.25 -2 puffs once a day to prevent cough and wheeze Continue Proventil inhaler 2 puffs every 4 hours as needed for cough or wheeze You may use Proventil 2 puffs 5-15 minutes before exercise to decrease cough and wheeze  Perennial and seasonal allergic rhinitis Begin Flonase or Nasocort 1-2 sprays in each nostril twice a day as needed for a stuffy nose Continue carbinoxamine 4 mg but increase to two or three times a day to help with thick mucus Continue azelastine nasal spray as needed for a runny nose Consider saline nasal rinses as needed for nasal symptoms. Use this before any medicated nasal sprays for best result For thick post nasal drainage, begin Mucinex [phone] mg twice a day If these medications do not improve your symptoms, consider allergen immunotherapy for long term control. If you are interested in this option, call the clinic and make an appointment for your first injection in about 2-3 weeks.  Allergic conjunctivitis of both eyes Continue Allergen avoidance measures Continue Pataday 1 drop in each eye once a day as needed for red or itchy eyes  Reflux Begin famotidine 20 mg twice a day for reflux Begin dietary and lifestyle modifications as listed below to control reflux. This may help reduce your cough Cut down chocolate and caffeine consumption Call us if this treatment plan is not working well for you  Follow up in the clinic in 5 months or sooner if needed  Lifestyle Changes for Controlling GERD When you have GERD, stomach acid feels as if it's backing up toward your mouth. Whether or not you take medication to control your GERD, your symptoms can often be improved with lifestyle changes.   Raise Your Head  Reflux is more likely to strike when you're lying down flat,  because stomach fluid can  flow backward more easily. Raising the head of your bed 4-6 inches can help. To do this:  Slide blocks or books under the legs at the head of your bed. Or, place a wedge under  the mattress. Many foam stores can make a suitable wedge for you. The wedge  should run from your waist to the top of your head.  Don't just prop your head on several pillows. This increases pressure on your  stomach. It can make GERD worse.  Watch Your Eating Habits Certain foods may increase the acid in your stomach or relax the lower esophageal sphincter, making GERD more likely. It's best to avoid the following:  Coffee, tea, and carbonated drinks (with and without caffeine)  Fatty, fried, or spicy food  Mint, chocolate, onions, and tomatoes  Any other foods that seem to irritate your stomach or cause you pain  Relieve the Pressure  Eat smaller meals, even if you have to eat more often.  Don't lie down right after you eat. Wait a few hours for your stomach to empty.  Avoid tight belts and tight-fitting clothes.  Lose excess weight.  Tobacco and Alcohol  Avoid smoking tobacco and drinking alcohol. They can make GERD symptoms worse.

## 2019-08-12 ENCOUNTER — Other Ambulatory Visit: Payer: Self-pay | Admitting: Family Medicine

## 2019-10-22 ENCOUNTER — Other Ambulatory Visit: Payer: Self-pay | Admitting: Family Medicine

## 2020-02-10 ENCOUNTER — Other Ambulatory Visit: Payer: Self-pay | Admitting: Family Medicine

## 2020-02-21 ENCOUNTER — Other Ambulatory Visit: Payer: Self-pay

## 2020-02-21 MED ORDER — ALBUTEROL SULFATE HFA 108 (90 BASE) MCG/ACT IN AERS: 2.0000 | INHALATION_SPRAY | RESPIRATORY_TRACT | 0 refills | Status: DC | PRN

## 2020-02-21 NOTE — Telephone Encounter (Signed)
Patient is given a courtesy refill on albuterol HFA x 1 with no refills. Previously courtesy rx refill was sent within 6 month time frame of last appointment. This rx is out of the 6 month time frame and is the last courtesy refill. Spoke with patient and she will call back to schedule a 6 month follow up.

## 2020-03-06 ENCOUNTER — Other Ambulatory Visit: Payer: Self-pay | Admitting: Family Medicine

## 2020-03-14 ENCOUNTER — Other Ambulatory Visit: Payer: Self-pay | Admitting: Family Medicine

## 2020-03-27 ENCOUNTER — Other Ambulatory Visit: Payer: Self-pay | Admitting: Family Medicine

## 2020-04-14 ENCOUNTER — Other Ambulatory Visit: Payer: Self-pay | Admitting: Family Medicine

## 2020-04-16 ENCOUNTER — Telehealth: Payer: Self-pay

## 2020-04-16 NOTE — Telephone Encounter (Signed)
Called and left a message for patient to call our office back to inform her that a courtesy refill will be sent for her Advair and Spiriva but she needs to make an appointment to continue receiving refills in the future.

## 2020-06-05 ENCOUNTER — Other Ambulatory Visit: Payer: Self-pay | Admitting: Family Medicine

## 2020-06-13 ENCOUNTER — Telehealth: Payer: Self-pay | Admitting: Family Medicine

## 2020-06-13 MED ORDER — SPIRIVA RESPIMAT 1.25 MCG/ACT IN AERS: INHALATION_SPRAY | RESPIRATORY_TRACT | 0 refills | Status: DC

## 2020-06-13 NOTE — Telephone Encounter (Signed)
Pt request refill for Spiriva, I explained that she already had a courtesy refill for this med. Pt states she never received courtesy refill for Spiriva but for her albuterol. Pt has appt schedule for 6/13.

## 2020-06-13 NOTE — Telephone Encounter (Signed)
spiriva 1.25 mcg sent in as a courtesy refill. Pt. Is aware and has a scheduled appointment for 006/13/2022

## 2020-07-10 ENCOUNTER — Other Ambulatory Visit: Payer: Self-pay | Admitting: Family Medicine

## 2020-07-16 ENCOUNTER — Other Ambulatory Visit: Payer: Self-pay

## 2020-07-16 ENCOUNTER — Ambulatory Visit (INDEPENDENT_AMBULATORY_CARE_PROVIDER_SITE_OTHER): Payer: 59 | Admitting: Family Medicine

## 2020-07-16 ENCOUNTER — Encounter: Payer: Self-pay | Admitting: Family Medicine

## 2020-07-16 VITALS — BP 128/72 | HR 96 | Temp 98.1°F | Resp 20 | Ht 64.25 in | Wt 330.4 lb

## 2020-07-16 DIAGNOSIS — J4540 Moderate persistent asthma, uncomplicated: Secondary | ICD-10-CM

## 2020-07-16 DIAGNOSIS — J3089 Other allergic rhinitis: Secondary | ICD-10-CM

## 2020-07-16 DIAGNOSIS — H1013 Acute atopic conjunctivitis, bilateral: Secondary | ICD-10-CM

## 2020-07-16 DIAGNOSIS — K219 Gastro-esophageal reflux disease without esophagitis: Secondary | ICD-10-CM | POA: Diagnosis not present

## 2020-07-16 MED ORDER — AZELASTINE HCL 0.15 % NA SOLN: 1.0000 | Freq: Two times a day (BID) | NASAL | 5 refills | Status: DC | PRN

## 2020-07-16 MED ORDER — ADVAIR HFA 230-21 MCG/ACT IN AERO: INHALATION_SPRAY | RESPIRATORY_TRACT | 6 refills | Status: DC

## 2020-07-16 MED ORDER — OLOPATADINE HCL 0.2 % OP SOLN: 1.0000 [drp] | Freq: Every day | OPHTHALMIC | 10 refills | Status: DC | PRN

## 2020-07-16 MED ORDER — MONTELUKAST SODIUM 10 MG PO TABS: ORAL_TABLET | ORAL | 10 refills | Status: DC

## 2020-07-16 MED ORDER — SPIRIVA RESPIMAT 1.25 MCG/ACT IN AERS: INHALATION_SPRAY | RESPIRATORY_TRACT | 6 refills | Status: DC

## 2020-07-16 MED ORDER — ALBUTEROL SULFATE HFA 108 (90 BASE) MCG/ACT IN AERS: 2.0000 | INHALATION_SPRAY | RESPIRATORY_TRACT | 6 refills | Status: DC | PRN

## 2020-07-16 MED ORDER — CARBINOXAMINE MALEATE 4 MG PO TABS: ORAL_TABLET | ORAL | 6 refills | Status: DC

## 2020-07-16 NOTE — Progress Notes (Signed)
[address] [place] [address] Dept: [phone]  FOLLOW UP NOTE  Patient ID: Deborah Owen, female    DOB: [birth year]  Age: 35 y.o. MRN: [id number] Date of Office Visit: 07/16/2020  Assessment  Chief Complaint: Follow-up (Overall improvements in allergy and asthma symptoms with medications and proper management.)  HPI Deborah Owen is a 35 year old female who presents to the clinic for follow-up visit.  She was last seen in this clinic on 06/23/2019 for evaluation of asthma, allergic rhinitis, allergic conjunctivitis, and reflux.  At today's visit, she reports her asthma has been well controlled with symptoms including shortness of breath occurring occasionally with vigorous activity and cough producing mucus while laughing. She denies wheeze with activity or rest.  She denies any asthma symptoms occurring overnight.  She continues Advair 230-2 puffs twice a day, Spiriva 2 puffs once in the morning, and albuterol before activity and about once a year for rescue.  She stopped taking montelukast at her last visit to this clinic and reports she is doing well without this medication.  Allergic rhinitis is reported as moderately well controlled with occasional sneezing and occasional postnasal drainage.  She continues carbinoxamine, Flonase, and azelastine as needed.  She is not currently using saline nasal rinses or taking Mucinex.  Allergic conjunctivitis is reported as well controlled with no current medical intervention.  Reflux is reported as well controlled with famotidine twice a day.  Her current medications are listed in the chart.   Drug Allergies:  No Known Allergies  Physical Exam: BP 128/72 (BP Location: Left Arm, Patient Position: Sitting, Cuff Size: Normal)   Pulse 96   Temp 98.1 F (36.7 C) (Temporal)   Resp 20   Ht 5' 4.25" (1.632 m)   Wt (!) 330 lb 6.4 oz (149.9 kg)   SpO2 97%   BMI 56.27 kg/m    Physical Exam Vitals reviewed.  Constitutional:      Appearance:  Normal appearance.  HENT:     Head: Normocephalic and atraumatic.     Right Ear: Tympanic membrane normal.     Left Ear: Tympanic membrane normal.     Nose:     Comments: Bilateral nares slightly erythematous with clear nasal drainage noted.  Pharynx normal.  Ears normal.  Eyes normal.    Mouth/Throat:     Pharynx: Oropharynx is clear.  Eyes:     Conjunctiva/sclera: Conjunctivae normal.  Cardiovascular:     Rate and Rhythm: Normal rate and regular rhythm.     Heart sounds: Normal heart sounds. No murmur heard. Pulmonary:     Effort: Pulmonary effort is normal.     Breath sounds: Normal breath sounds.     Comments: Lungs clear to auscultation Musculoskeletal:        General: Normal range of motion.     Cervical back: Normal range of motion and neck supple.  Skin:    General: Skin is warm and dry.  Neurological:     Mental Status: She is alert and oriented to person, place, and time.  Psychiatric:        Mood and Affect: Mood normal.        Behavior: Behavior normal.        Thought Content: Thought content normal.        Judgment: Judgment normal.    Diagnostics: FVC 3.92, FEV1 3.32.  Predicted FVC 3.77, predicted FEV1 3.15.  Spirometry indicates normal ventilatory function  Assessment and Plan: 1. Moderate persistent asthma without complication  2. Perennial and seasonal allergic rhinitis   3. Allergic conjunctivitis of both eyes   4. Gastroesophageal reflux disease, unspecified whether esophagitis present     Meds ordered this encounter  Medications   fluticasone-salmeterol (ADVAIR HFA) 230-21 MCG/ACT inhaler    Sig: TAKE 2 PUFFS BY MOUTH TWICE A DAY    Dispense:  12 g    Refill:  6    This is a courtesy refill.   albuterol (VENTOLIN HFA) 108 (90 Base) MCG/ACT inhaler    Sig: Inhale 2 puffs into the lungs every 4 (four) hours as needed for wheezing or shortness of breath.    Dispense:  18 each    Refill:  6    Patient needs appointment for additional refills.    Azelastine HCl 0.15 % SOLN    Sig: Place 1-2 sprays into both nostrils 2 (two) times daily as needed.    Dispense:  30 mL    Refill:  5   Carbinoxamine Maleate 4 MG TABS    Sig: One tablet (4mg ) two or three times a day as needed.    Dispense:  180 tablet    Refill:  6   montelukast (SINGULAIR) 10 MG tablet    Sig: TAKE 1 TABLET BY MOUTH EVERYDAY AT BEDTIME    Dispense:  30 tablet    Refill:  10   Tiotropium Bromide Monohydrate (SPIRIVA RESPIMAT) 1.25 MCG/ACT AERS    Sig: INHALE 2 PUFFS BY MOUTH INTO THE LUNGS DAILY    Dispense:  4 g    Refill:  6    This is a courtesy refill. Pt. Does have an appointment 06/13. Will notify the regarding the courtesy refill.   Olopatadine HCl (PATADAY) 0.2 % SOLN    Sig: Place 1 drop into both eyes daily as needed.    Dispense:  2.5 mL    Refill:  10     Patient Instructions  Moderate persistent asthma Continue Advair 230/21 -2 puffs twice a day with a spacer to prevent cough and wheeze Continue Spiriva 1.25 -2 puffs once a day to prevent cough and wheeze Continue Proventil inhaler 2 puffs every 4 hours as needed for cough or wheeze You may use Proventil 2 puffs 5-15 minutes before exercise to decrease cough and wheeze  Perennial and seasonal allergic rhinitis Continue allergen avoidance measures directed toward grass pollen, cat, and dog as listed below Continue Flonase or Nasocort 1-2 sprays in each nostril twice a day as needed for a stuffy nose Continue carbinoxamine 4 mg but increase to two or three times a day to help with thick mucus Continue azelastine nasal spray as needed for a runny nose Consider saline nasal rinses as needed for nasal symptoms. Use this before any medicated nasal sprays for best result For thick post nasal drainage, begin Mucinex [phone] mg twice a day  Allergic conjunctivitis of both eyes Continue allergen avoidance measures directed toward tree pollen, cat and dog as listed below Continue Pataday 1 drop in each  eye once a day as needed for red or itchy eyes  Reflux Continue dietary and lifestyle modifications as listed below to control reflux. This may help reduce your cough  Call 7/13 if this treatment plan is not working well for you  Follow up in the clinic in 6 months or sooner if needed   Return in about 6 months (around 01/15/2021), or if symptoms worsen or fail to improve.    Thank you for the opportunity to care for  this patient.  Please do not hesitate to contact me with questions.  Gareth Morgan, FNP [facility] [place] [facility]

## 2020-07-16 NOTE — Patient Instructions (Addendum)
Moderate persistent asthma Continue Advair 230/21 -2 puffs twice a day with a spacer to prevent cough and wheeze Continue Spiriva 1.25 -2 puffs once a day to prevent cough and wheeze Continue Proventil inhaler 2 puffs every 4 hours as needed for cough or wheeze You may use Proventil 2 puffs 5-15 minutes before exercise to decrease cough and wheeze  Perennial and seasonal allergic rhinitis Continue allergen avoidance measures directed toward grass pollen, cat, and dog as listed below Continue Flonase or Nasocort 1-2 sprays in each nostril twice a day as needed for a stuffy nose Continue carbinoxamine 4 mg but increase to two or three times a day to help with thick mucus Continue azelastine nasal spray as needed for a runny nose Consider saline nasal rinses as needed for nasal symptoms. Use this before any medicated nasal sprays for best result For thick post nasal drainage, begin Mucinex [phone] mg twice a day  Allergic conjunctivitis of both eyes Continue allergen avoidance measures directed toward tree pollen, cat and dog as listed below Continue Pataday 1 drop in each eye once a day as needed for red or itchy eyes  Reflux Continue dietary and lifestyle modifications as listed below to control reflux. This may help reduce your cough  Call us if this treatment plan is not working well for you  Follow up in the clinic in 6 months or sooner if needed  Lifestyle Changes for Controlling GERD When you have GERD, stomach acid feels as if it's backing up toward your mouth. Whether or not you take medication to control your GERD, your symptoms can often be improved with lifestyle changes.   Raise Your Head Reflux is more likely to strike when you're lying down flat, because stomach fluid can flow backward more easily. Raising the head of your bed 4-6 inches can help. To do this: Slide blocks or books under the legs at the head of your bed. Or, place a wedge under the mattress. Many foam  stores can make a suitable wedge for you. The wedge should run from your waist to the top of your head. Don't just prop your head on several pillows. This increases pressure on your stomach. It can make GERD worse.  Watch Your Eating Habits Certain foods may increase the acid in your stomach or relax the lower esophageal sphincter, making GERD more likely. It's best to avoid the following: Coffee, tea, and carbonated drinks (with and without caffeine) Fatty, fried, or spicy food Mint, chocolate, onions, and tomatoes Any other foods that seem to irritate your stomach or cause you pain  Relieve the Pressure Eat smaller meals, even if you have to eat more often. Don't lie down right after you eat. Wait a few hours for your stomach to empty. Avoid tight belts and tight-fitting clothes. Lose excess weight.  Tobacco and Alcohol Avoid smoking tobacco and drinking alcohol. They can make GERD symptoms worse.  Reducing Pollen Exposure The American Academy of Allergy, Asthma and Immunology suggests the following steps to reduce your exposure to pollen during allergy seasons. Do not hang sheets or clothing out to dry; pollen may collect on these items. Do not mow lawns or spend time around freshly cut grass; mowing stirs up pollen. Keep windows closed at night.  Keep car windows closed while driving. Minimize morning activities outdoors, a time when pollen counts are usually at their highest. Stay indoors as much as possible when pollen counts or humidity is high and on windy days when pollen tends to  remain in the air longer. Use air conditioning when possible.  Many air conditioners have filters that trap the pollen spores. Use a HEPA room air filter to remove pollen form the indoor air you breathe.  Control of Dog or Cat Allergen Avoidance is the best way to manage a dog or cat allergy. If you have a dog or cat and are allergic to dog or cats, consider removing the dog or cat from the  home. If you have a dog or cat but don't want to find it a new home, or if your family wants a pet even though someone in the household is allergic, here are some strategies that may help keep symptoms at bay:  Keep the pet out of your bedroom and restrict it to only a few rooms. Be advised that keeping the dog or cat in only one room will not limit the allergens to that room. Don't pet, hug or kiss the dog or cat; if you do, wash your hands with soap and water. High-efficiency particulate air (HEPA) cleaners run continuously in a bedroom or living room can reduce allergen levels over time. Regular use of a high-efficiency vacuum cleaner or a central vacuum can reduce allergen levels. Giving your dog or cat a bath at least once a week can reduce airborne allergen.

## 2020-07-20 ENCOUNTER — Other Ambulatory Visit: Payer: Self-pay | Admitting: Family Medicine

## 2020-07-26 ENCOUNTER — Other Ambulatory Visit: Payer: Self-pay | Admitting: Family Medicine

## 2020-08-12 ENCOUNTER — Other Ambulatory Visit: Payer: Self-pay | Admitting: Family Medicine

## 2020-08-13 ENCOUNTER — Other Ambulatory Visit: Payer: Self-pay | Admitting: Family Medicine

## 2020-08-13 NOTE — Telephone Encounter (Signed)
Please advise change in nasal spray. Thank You.

## 2020-08-14 NOTE — Telephone Encounter (Signed)
0.1% azelastine is fine. Thank you

## 2020-08-17 ENCOUNTER — Other Ambulatory Visit: Payer: Self-pay | Admitting: *Deleted

## 2020-08-17 MED ORDER — AZELASTINE HCL 0.1 % NA SOLN: 1.0000 | Freq: Every day | NASAL | 5 refills | Status: DC | PRN

## 2020-11-01 ENCOUNTER — Encounter (HOSPITAL_BASED_OUTPATIENT_CLINIC_OR_DEPARTMENT_OTHER): Payer: Self-pay | Admitting: *Deleted

## 2020-11-01 ENCOUNTER — Other Ambulatory Visit: Payer: Self-pay

## 2020-11-01 ENCOUNTER — Emergency Department (HOSPITAL_BASED_OUTPATIENT_CLINIC_OR_DEPARTMENT_OTHER)
Admission: EM | Admit: 2020-11-01 | Discharge: 2020-11-02 | Disposition: A | Discharge status: Home / Self Care | Payer: 59 | Attending: Emergency Medicine | Admitting: Emergency Medicine

## 2020-11-01 DIAGNOSIS — J45909 Unspecified asthma, uncomplicated: Secondary | ICD-10-CM | POA: Diagnosis not present

## 2020-11-01 DIAGNOSIS — J189 Pneumonia, unspecified organism: Secondary | ICD-10-CM | POA: Diagnosis not present

## 2020-11-01 DIAGNOSIS — R059 Cough, unspecified: Secondary | ICD-10-CM | POA: Diagnosis present

## 2020-11-01 NOTE — ED Triage Notes (Signed)
Cough, epigastric pain when she moves,  body aches and fever.

## 2020-11-02 ENCOUNTER — Emergency Department (HOSPITAL_BASED_OUTPATIENT_CLINIC_OR_DEPARTMENT_OTHER): Payer: 59

## 2020-11-02 MED ORDER — AZITHROMYCIN 250 MG PO TABS: ORAL_TABLET | ORAL | 0 refills | Status: DC

## 2020-11-02 MED ORDER — FLUCONAZOLE 150 MG PO TABS: ORAL_TABLET | ORAL | 0 refills | Status: DC

## 2020-11-02 MED ORDER — CEFUROXIME AXETIL 500 MG PO TABS: 500.0000 mg | ORAL_TABLET | Freq: Two times a day (BID) | ORAL | 0 refills | Status: DC

## 2020-11-02 NOTE — ED Provider Notes (Signed)
[facility] Provider Note: [name], MD, FACEP  CSN: [id number] MRN: [id number] ARRIVAL: 11/01/20 at 2223 ROOM: [facility]02/[facility]02   CHIEF COMPLAINT  Cough   HISTORY OF PRESENT ILLNESS  11/02/20 1:58 AM Deborah Owen is a 35 y.o. female who developed a cough, subjective fever, chills and body aches yesterday morning about 4 AM.  Those symptoms have pretty much resolved but she is here now because she has subsequently developed hemoptysis (blood-streaked sputum).  She is having no shortness of breath, nasal congestion or sore throat.  Symptoms are not severe.   Past Medical History:  Diagnosis Date   Asthma     History reviewed. No pertinent surgical history.  Family History  Problem Relation Age of Onset   Allergic rhinitis Brother    Asthma Neg Hx    Eczema Neg Hx    Urticaria Neg Hx     Social History   Tobacco Use   Smoking status: Never   Smokeless tobacco: Never  Vaping Use   Vaping Use: Never used  Substance Use Topics   Alcohol use: Never    Comment: 1-2 beers rarely   Drug use: Never    Prior to Admission medications   Medication Sig Start Date End Date Taking? Authorizing Provider  albuterol (VENTOLIN HFA) 108 (90 Base) MCG/ACT inhaler Inhale 2 puffs into the lungs every 4 (four) hours as needed for wheezing or shortness of breath. 07/16/20  Yes Ambs, Norvel Richards, FNP  azithromycin (ZITHROMAX Z-PAK) 250 MG tablet Take 2 tablets today then 1 tablet daily for 4 days. 11/02/20  Yes Kianni Lheureux, MD  cefUROXime (CEFTIN) 500 MG tablet Take 1 tablet (500 mg total) by mouth 2 (two) times daily with a meal. 11/02/20  Yes Hayly Litsey, MD  fluticasone-salmeterol (ADVAIR HFA) 230-21 MCG/ACT inhaler TAKE 2 PUFFS BY MOUTH TWICE A DAY 07/16/20  Yes Ambs, Norvel Richards, FNP  montelukast (SINGULAIR) 10 MG tablet TAKE 1 TABLET BY MOUTH EVERYDAY AT BEDTIME 07/16/20  Yes Ambs, Norvel Richards, FNP  Tiotropium Bromide Monohydrate (SPIRIVA RESPIMAT) 1.25 MCG/ACT AERS INHALE 2 PUFFS BY MOUTH  INTO THE LUNGS DAILY 07/16/20  Yes Ambs, Norvel Richards, FNP  azelastine (ASTELIN) 0.1 % nasal spray Place 1 spray into both nostrils daily as needed for rhinitis. 08/17/20   Ambs, Norvel Richards, FNP  Azelastine HCl 0.15 % SOLN PLACE 1-2 SPRAYS INTO BOTH NOSTRILS 2 (TWO) TIMES DAILY AS NEEDED. 08/13/20   Ambs, Norvel Richards, FNP  Carbinoxamine Maleate 4 MG TABS One tablet (4mg ) two or three times a day as needed. 07/16/20   07/18/20, FNP  Olopatadine HCl (PATADAY) 0.2 % SOLN Place 1 drop into both eyes daily as needed. 07/16/20   07/18/20, FNP    Allergies Patient has no known allergies.   REVIEW OF SYSTEMS  Negative except as noted here or in the History of Present Illness.   PHYSICAL EXAMINATION  Initial Vital Signs Blood pressure (!) 144/99, pulse (!) 114, temperature 98.3 F (36.8 C), temperature source Oral, resp. rate 18, height 5' 4.25" (1.632 m), weight (!) 149.9 kg, SpO2 96 %.  Examination General: Well-developed, high BMI female in no acute distress; appearance consistent with age of record HENT: normocephalic; atraumatic Eyes: Normal appearance Neck: supple Heart: regular rate and rhythm Lungs: clear to auscultation bilaterally Abdomen: soft; nondistended; nontender; bowel sounds present Extremities: No deformity; full range of motion Neurologic: Awake, alert and oriented; motor function intact in all extremities and symmetric; no facial droop  Skin: Warm and dry Psychiatric: Normal mood and affect   RESULTS  Summary of this visit's results, reviewed and interpreted by myself:   EKG Interpretation  Date/Time:    Ventricular Rate:    PR Interval:    QRS Duration:   QT Interval:    QTC Calculation:   R Axis:     Text Interpretation:         Laboratory Studies: No results found for this or any previous visit (from the past 24 hour(s)). Imaging Studies: DG Chest 2 View  Result Date: 11/02/2020 CLINICAL DATA:  Cough and epigastric pain. EXAM: CHEST - 2 VIEW COMPARISON:   None. FINDINGS: Moderate to marked severity infiltrate is seen within the right middle lobe. There is no evidence of a pleural effusion or pneumothorax. The heart size and mediastinal contours are within normal limits. The visualized skeletal structures are unremarkable. IMPRESSION: Moderate to marked severity right middle lobe infiltrate. Electronically Signed   By: Aram Candela M.D.   On: 11/02/2020 02:34    ED COURSE and MDM  Nursing notes, initial and subsequent vitals signs, including pulse oximetry, reviewed and interpreted by myself.  Vitals:   11/01/20 2230 11/01/20 2232 11/01/20 2236 11/02/20 0226  BP:  (!) 144/99  139/83  Pulse:  (!) 120 (!) 114 (!) 104  Resp:  18  18  Temp:  98.3 F (36.8 C)    TempSrc:  Oral    SpO2:  93% 96% 97%  Weight: (!) 149.9 kg     Height: 5' 4.25" (1.632 m)      Medications - No data to display  Chest x-ray shows right middle lobe pneumonia which is consistent with the patient's presentation.  She did have COVID several weeks ago and this may be secondary bacterial infection.   PROCEDURES  Procedures   ED DIAGNOSES     ICD-10-CM   1. Community acquired pneumonia of right middle lobe of lung  J18.9          Yeilyn Gent, MD 11/02/20 [phone]

## 2020-12-29 ENCOUNTER — Other Ambulatory Visit: Payer: Self-pay | Admitting: Family Medicine

## 2021-01-17 ENCOUNTER — Other Ambulatory Visit: Payer: Self-pay | Admitting: Family Medicine

## 2021-02-11 ENCOUNTER — Other Ambulatory Visit: Payer: Self-pay | Admitting: Family Medicine

## 2021-02-12 ENCOUNTER — Other Ambulatory Visit: Payer: Self-pay

## 2021-02-12 MED ORDER — SPIRIVA RESPIMAT 1.25 MCG/ACT IN AERS: INHALATION_SPRAY | RESPIRATORY_TRACT | 0 refills | Status: DC

## 2021-02-12 NOTE — Telephone Encounter (Signed)
Sent in a courtesy refill. It may her 2cd, no more refill until patient keep her appointment 02/20/2021 with Thurston Hole.

## 2021-02-20 ENCOUNTER — Other Ambulatory Visit: Payer: Self-pay

## 2021-02-20 ENCOUNTER — Ambulatory Visit (INDEPENDENT_AMBULATORY_CARE_PROVIDER_SITE_OTHER): Payer: BC Managed Care – PPO | Admitting: Family Medicine

## 2021-02-20 ENCOUNTER — Encounter: Payer: Self-pay | Admitting: Family Medicine

## 2021-02-20 VITALS — BP 126/78 | HR 76 | Temp 97.9°F | Resp 17 | Ht 63.0 in | Wt 341.0 lb

## 2021-02-20 DIAGNOSIS — H1013 Acute atopic conjunctivitis, bilateral: Secondary | ICD-10-CM

## 2021-02-20 DIAGNOSIS — J4540 Moderate persistent asthma, uncomplicated: Secondary | ICD-10-CM

## 2021-02-20 DIAGNOSIS — J3089 Other allergic rhinitis: Secondary | ICD-10-CM | POA: Diagnosis not present

## 2021-02-20 DIAGNOSIS — K219 Gastro-esophageal reflux disease without esophagitis: Secondary | ICD-10-CM

## 2021-02-20 DIAGNOSIS — J302 Other seasonal allergic rhinitis: Secondary | ICD-10-CM

## 2021-02-20 MED ORDER — BREZTRI AEROSPHERE 160-9-4.8 MCG/ACT IN AERO: 2.0000 | INHALATION_SPRAY | Freq: Two times a day (BID) | RESPIRATORY_TRACT | 5 refills | Status: DC

## 2021-02-20 MED ORDER — CARBINOXAMINE MALEATE 4 MG PO TABS: ORAL_TABLET | ORAL | 6 refills | Status: DC

## 2021-02-20 MED ORDER — ALBUTEROL SULFATE HFA 108 (90 BASE) MCG/ACT IN AERS: 2.0000 | INHALATION_SPRAY | RESPIRATORY_TRACT | 6 refills | Status: DC | PRN

## 2021-02-20 NOTE — Patient Instructions (Addendum)
Moderate persistent asthma Begin Breztri 2 puffs twice a day with a spacer to prevent cough or wheeze.  This will take the place of Advair and Spiriva. Continue albuterol 2 puffs every 4 hours as needed for cough or wheeze You may use albuterol 2 puffs 5-15 minutes before exercise to decrease cough and wheeze  Perennial and seasonal allergic rhinitis Continue allergen avoidance measures directed toward grass pollen, cat, and dog as listed below Continue carbinoxamine 4 mg two or three times a day as needed for runny nose or itch Continue Flonase or Nasocort 1-2 sprays in each nostril twice a day as needed for a stuffy nose Consider saline nasal rinses as needed for nasal symptoms. Use this before any medicated nasal sprays for best result For thick post nasal drainage, begin Mucinex [phone] mg twice a day  Allergic conjunctivitis of both eyes Continue Pataday 1 drop in each eye once a day as needed for red or itchy eyes  Reflux Continue dietary and lifestyle modifications as listed below to control reflux.   Call us if this treatment plan is not working well for you  Follow up in the clinic in 6 months or sooner if needed  Lifestyle Changes for Controlling GERD When you have GERD, stomach acid feels as if its backing up toward your mouth. Whether or not you take medication to control your GERD, your symptoms can often be improved with lifestyle changes.   Raise Your Head Reflux is more likely to strike when youre lying down flat, because stomach fluid can flow backward more easily. Raising the head of your bed 4-6 inches can help. To do this: Slide blocks or books under the legs at the head of your bed. Or, place a wedge under the mattress. Many foam stores can make a suitable wedge for you. The wedge should run from your waist to the top of your head. Dont just prop your head on several pillows. This increases pressure on your stomach. It can make GERD worse.  Watch Your  Eating Habits Certain foods may increase the acid in your stomach or relax the lower esophageal sphincter, making GERD more likely. Its best to avoid the following: Coffee, tea, and carbonated drinks (with and without caffeine) Fatty, fried, or spicy food Mint, chocolate, onions, and tomatoes Any other foods that seem to irritate your stomach or cause you pain  Relieve the Pressure Eat smaller meals, even if you have to eat more often. Dont lie down right after you eat. Wait a few hours for your stomach to empty. Avoid tight belts and tight-fitting clothes. Lose excess weight.  Tobacco and Alcohol Avoid smoking tobacco and drinking alcohol. They can make GERD symptoms worse.  Reducing Pollen Exposure The American Academy of Allergy, Asthma and Immunology suggests the following steps to reduce your exposure to pollen during allergy seasons. Do not hang sheets or clothing out to dry; pollen may collect on these items. Do not mow lawns or spend time around freshly cut grass; mowing stirs up pollen. Keep windows closed at night.  Keep car windows closed while driving. Minimize morning activities outdoors, a time when pollen counts are usually at their highest. Stay indoors as much as possible when pollen counts or humidity is high and on windy days when pollen tends to remain in the air longer. Use air conditioning when possible.  Many air conditioners have filters that trap the pollen spores. Use a HEPA room air filter to remove pollen form the indoor air you  breathe.  Control of Dog or Cat Allergen Avoidance is the best way to manage a dog or cat allergy. If you have a dog or cat and are allergic to dog or cats, consider removing the dog or cat from the home. If you have a dog or cat but dont want to find it a new home, or if your family wants a pet even though someone in the household is allergic, here are some strategies that may help keep symptoms at bay:  Keep the pet out of your  bedroom and restrict it to only a few rooms. Be advised that keeping the dog or cat in only one room will not limit the allergens to that room. Dont pet, hug or kiss the dog or cat; if you do, wash your hands with soap and water. High-efficiency particulate air (HEPA) cleaners run continuously in a bedroom or living room can reduce allergen levels over time. Regular use of a high-efficiency vacuum cleaner or a central vacuum can reduce allergen levels. Giving your dog or cat a bath at least once a week can reduce airborne allergen.

## 2021-02-20 NOTE — Progress Notes (Signed)
[address] [place] [address] Dept: [phone]  FOLLOW UP NOTE  Patient ID: Deborah Owen, female    DOB: 07/04/1985  Age: 36 y.o. MRN: [id number] Date of Office Visit: 02/20/2021  Assessment  Chief Complaint: Follow-up (Pt states she have been doing well, asthma and allergies symptoms are well managed./), Asthma, and Allergic Rhinitis   HPI Deborah Owen is a 36 year old female who presents the clinic for follow-up visit.  She was last seen in this clinic on 07/16/2020 by Thermon Leyland, FNP, for evaluation of asthma, allergic rhinitis, allergic conjunctivitis, and reflux.  In the interim, she visited an urgent care on 11/01/2020 and was diagnosed with community-acquired pneumonia by x-ray.  At today's visit, she reports her asthma has been well controlled with occasional shortness of breath after 2-3 flights of stairs and occasional cough producing clear mucus.  She denies shortness of breath or wheeze with moderate activity or rest.  She continues Advair 230-2 puffs twice a day with a spacer, Spiriva 1.25 mcg 2 puffs once a day, and albuterol before activity.  She has not taken montelukast for over 1 year.  Allergic rhinitis is reported as moderately well controlled with symptoms including sneezing, occasional clear rhinorrhea, and infrequent postnasal drainage.  She continues carbinoxamine 4 mg as needed, Mucinex as needed, and is not currently using Flonase nasal spray or nasal saline rinses.  Her last environmental allergy skin testing was on 11/12/2017 was positive to grass, cat, and dog. Allergic conjunctivitis is reported as moderately well controlled with watery eyes occasionally when exposed to dogs or cats.  Reflux is reported as well controlled with occasional omeprazole.  She does report an episode of food poisoning that occurred about 6 months ago for which she took omeprazole with relief of symptoms.  Her current medications are listed in the chart.  Drug Allergies:  No Known  Allergies  Physical Exam: BP 126/78    Pulse 76    Temp 97.9 F (36.6 C) (Temporal)    Resp 17    Ht 5\' 3"  (1.6 m)    Wt (!) 341 lb (154.7 kg)    SpO2 97%    BMI 60.41 kg/m    Physical Exam Vitals reviewed.  Constitutional:      Appearance: Normal appearance.  HENT:     Head: Normocephalic and atraumatic.     Right Ear: Tympanic membrane normal.     Left Ear: Tympanic membrane normal.     Nose:     Comments: Bilateral nares slightly erythematous with clear nasal drainage noted.  Pharynx normal.  Ears normal.  Eyes normal.    Mouth/Throat:     Pharynx: Oropharynx is clear.  Eyes:     Conjunctiva/sclera: Conjunctivae normal.  Cardiovascular:     Rate and Rhythm: Normal rate and regular rhythm.     Heart sounds: Normal heart sounds. No murmur heard. Pulmonary:     Effort: Pulmonary effort is normal.     Breath sounds: Normal breath sounds.     Comments: Lungs clear to auscultation Musculoskeletal:        General: Normal range of motion.     Cervical back: Normal range of motion and neck supple.  Skin:    General: Skin is warm and dry.  Neurological:     Mental Status: She is alert and oriented to person, place, and time.  Psychiatric:        Mood and Affect: Mood normal.  Behavior: Behavior normal.        Thought Content: Thought content normal.        Judgment: Judgment normal.    Diagnostics: FVC 3.19, FEV1 2.75.  Predicted FVC 3.64, predicted FEV1 3.05.  Spirometry indicates normal ventilatory function.  Assessment and Plan: 1. Moderate persistent asthma without complication   2. Seasonal and perennial allergic rhinitis   3. Allergic conjunctivitis of both eyes   4. Gastroesophageal reflux disease, unspecified whether esophagitis present     Meds ordered this encounter  Medications   albuterol (VENTOLIN HFA) 108 (90 Base) MCG/ACT inhaler    Sig: Inhale 2 puffs into the lungs every 4 (four) hours as needed for wheezing or shortness of breath.    Dispense:   18 each    Refill:  6    Patient needs appointment for additional refills.   Carbinoxamine Maleate 4 MG TABS    Sig: One tablet (4mg ) two or three times a day as needed.    Dispense:  180 tablet    Refill:  6   Budeson-Glycopyrrol-Formoterol (BREZTRI AEROSPHERE) 160-9-4.8 MCG/ACT AERO    Sig: Inhale 2 puffs into the lungs 2 (two) times daily.    Dispense:  10.7 g    Refill:  5    Patient Instructions  Moderate persistent asthma Begin Breztri 2 puffs twice a day with a spacer to prevent cough or wheeze.  This will take the place of Advair and Spiriva. Continue albuterol 2 puffs every 4 hours as needed for cough or wheeze You may use albuterol 2 puffs 5-15 minutes before exercise to decrease cough and wheeze  Perennial and seasonal allergic rhinitis Continue allergen avoidance measures directed toward grass pollen, cat, and dog as listed below Continue carbinoxamine 4 mg two or three times a day as needed for runny nose or itch Continue Flonase or Nasocort 1-2 sprays in each nostril twice a day as needed for a stuffy nose Consider saline nasal rinses as needed for nasal symptoms. Use this before any medicated nasal sprays for best result For thick post nasal drainage, begin Mucinex [phone] mg twice a day  Allergic conjunctivitis of both eyes Continue Pataday 1 drop in each eye once a day as needed for red or itchy eyes  Reflux Continue dietary and lifestyle modifications as listed below to control reflux.   Call if this treatment plan is not working well for you  Follow up in the clinic in 6 months or sooner if needed   Return in about 6 months (around 08/20/2021), or if symptoms worsen or fail to improve.    Thank you for the opportunity to care for this patient.  Please do not hesitate to contact me with questions.  08/22/2021, FNP [facility]

## 2021-03-05 ENCOUNTER — Telehealth: Payer: Self-pay | Admitting: Family Medicine

## 2021-03-05 NOTE — Telephone Encounter (Signed)
Pt was given a sample of Breztri, pt states she will stick with the meds that she is currently taking. She did not like the Jal, because she had some side effects from it.

## 2021-03-06 ENCOUNTER — Other Ambulatory Visit: Payer: Self-pay

## 2021-03-06 MED ORDER — FLUTICASONE-SALMETEROL 230-21 MCG/ACT IN AERO: 2.0000 | INHALATION_SPRAY | Freq: Two times a day (BID) | RESPIRATORY_TRACT | 5 refills | Status: DC

## 2021-03-06 MED ORDER — SPIRIVA RESPIMAT 1.25 MCG/ACT IN AERS: INHALATION_SPRAY | RESPIRATORY_TRACT | 5 refills | Status: DC

## 2021-03-06 NOTE — Telephone Encounter (Signed)
Thank you. Can you please find out if she needs a refill on Advair or spiriva? Thank you

## 2021-03-06 NOTE — Telephone Encounter (Signed)
Thank you :)

## 2021-04-30 ENCOUNTER — Other Ambulatory Visit: Payer: Self-pay | Admitting: Family Medicine

## 2021-06-25 ENCOUNTER — Other Ambulatory Visit: Payer: Self-pay | Admitting: Family Medicine

## 2021-07-30 ENCOUNTER — Other Ambulatory Visit: Payer: Self-pay | Admitting: Family Medicine

## 2021-10-07 ENCOUNTER — Other Ambulatory Visit: Payer: Self-pay | Admitting: Family Medicine

## 2021-10-30 ENCOUNTER — Ambulatory Visit (INDEPENDENT_AMBULATORY_CARE_PROVIDER_SITE_OTHER): Payer: BC Managed Care – PPO | Admitting: Internal Medicine

## 2021-10-30 ENCOUNTER — Encounter: Payer: Self-pay | Admitting: Internal Medicine

## 2021-10-30 VITALS — BP 92/66 | HR 75 | Temp 97.7°F | Resp 21

## 2021-10-30 DIAGNOSIS — H1013 Acute atopic conjunctivitis, bilateral: Secondary | ICD-10-CM

## 2021-10-30 DIAGNOSIS — J3089 Other allergic rhinitis: Secondary | ICD-10-CM

## 2021-10-30 DIAGNOSIS — J302 Other seasonal allergic rhinitis: Secondary | ICD-10-CM

## 2021-10-30 DIAGNOSIS — J4540 Moderate persistent asthma, uncomplicated: Secondary | ICD-10-CM

## 2021-10-30 MED ORDER — SPIRIVA RESPIMAT 1.25 MCG/ACT IN AERS: INHALATION_SPRAY | RESPIRATORY_TRACT | 5 refills | Status: DC

## 2021-10-30 MED ORDER — ALBUTEROL SULFATE HFA 108 (90 BASE) MCG/ACT IN AERS: 2.0000 | INHALATION_SPRAY | Freq: Four times a day (QID) | RESPIRATORY_TRACT | 1 refills | Status: DC | PRN

## 2021-10-30 MED ORDER — AZELASTINE HCL 0.1 % NA SOLN: 2.0000 | Freq: Two times a day (BID) | NASAL | 5 refills | Status: DC

## 2021-10-30 MED ORDER — FLUTICASONE-SALMETEROL 230-21 MCG/ACT IN AERO: 2.0000 | INHALATION_SPRAY | Freq: Two times a day (BID) | RESPIRATORY_TRACT | 5 refills | Status: DC

## 2021-10-30 MED ORDER — CARBINOXAMINE MALEATE 4 MG PO TABS: 4.0000 mg | ORAL_TABLET | Freq: Two times a day (BID) | ORAL | 5 refills | Status: DC | PRN

## 2021-10-30 NOTE — Patient Instructions (Addendum)
Asthma: - Maintenance inhaler: Advair 236mcg 2 puffs twice daily and Spiriva 2 puffs daily - Rescue inhaler: Albuterol 2 puffs via spacer or 1 vial via nebulizer every 4-6 hours as needed for respiratory symptoms of cough, shortness of breath, or wheezing  Rhinitis: - Avoidance measures discussed. - Use nasal saline rinses before nose sprays such as with Neilmed Sinus Rinse.  Use distilled water.   - Use Azelastine 1-2 sprays each nostril twice daily. Aim upward and outward. - Use Carbinoxamine 4mg  twice daily as needed. - Consider allergy shots as long term control of your symptoms by teaching your immune system to be more tolerant of your allergy triggers

## 2021-10-30 NOTE — Progress Notes (Signed)
   [place] [address] Dept: [phone]  FOLLOW UP NOTE  Patient ID: Deborah Owen, female    DOB: Jan 10, 1986  Age: 36 y.o. MRN: [id number] Date of Office Visit: 10/30/2021  Assessment  Chief Complaint: No chief complaint on file.  HPI Deborah Owen is a 36 year old female who presents the clinic for follow-up visit.    Asthma ACT score: ACT Total Score: 21.   Limitation: none Symptoms: she rarely has SOB/wheezing/coughing except when she does not take her medications.  She does have some exertional symptoms so sometimes pretreats with the albuterol.  She is on Advair 230 2 puffs BID and Spiriva 2 puffs daily.  We tried Breztri last visit but she had increased congestion and did not like the inhaler so she went back to Advair/Spiriva. Nighttime Sxs: none since last visit ER visits/clinic visits: none since last visit Oral prednisone courses: none since last visit Albuterol use: 2-3 times a week with pretreatment for a long walk/jog/exercise games. Otherwise, she requires about 1x/ week or less.    Allergies She is having trouble with increased congestion, drainage, rhinorrhea, wet cough.  She has used azelastine in the past which helped but has not used it recently due to no refills.  She also uses carbinoxamine 4 mg about 2-3 times a week which does help her symptoms.  She has tried second-generation antihistamines in the past without much improvement.  She has montelukast listed on her medications but she has not taken this in the long-term.   Drug Allergies:  No Known Allergies  Physical Exam: BP 92/66   Pulse 75   Temp 97.7 F (36.5 C) (Temporal)   Resp (!) 21   SpO2 98%    GEN: alert, well developed HEENT: clear conjunctiva, TM grey and translucent, nose with + inferior turbinate hypertrophy, pale nasal mucosa, slight clear rhinorrhea, + cobblestoning HEART: regular rate and rhythm, no murmur LUNGS: clear to auscultation bilaterally, no coughing,  unlabored respiration ABDOMEN: soft, non distended  SKIN: no rashes or lesions   Data Review:  SPT10/11/2017 was positive to grass, cat, and dog  Assessment and Plan: 1. Moderate persistent asthma without complication   2. Seasonal and perennial allergic rhinitis   3. Allergic conjunctivitis of both eyes     Moderate persistent asthma, Controlled - Maintenance inhaler: Advair 217mcg 2 puffs twice daily and Spiriva 2 puffs daily - Rescue inhaler: Albuterol 2 puffs via spacer or 1 vial via nebulizer every 4-6 hours as needed for respiratory symptoms of cough, shortness of breath, or wheezing or 15 minutes prior to exercise.  - Spirometry at next visit.  Perennial and seasonal allergic rhinitis Allergic Conjunctivitis - Avoidance measures discussed. - Use nasal saline rinses before nose sprays such as with Neilmed Sinus Rinse.  Use distilled water.   - Use Azelastine 1-2 sprays each nostril twice daily. Aim upward and outward. - Use Carbinoxamine 4mg  twice daily as needed. - Consider allergy shots as long term control of your symptoms by teaching your immune system to be more tolerant of your allergy triggers   Return in about 4 months (around 03/01/2022).    Thank you for the opportunity to care for this patient.  Please do not hesitate to contact me with questions.  [facility] [place]

## 2022-02-07 ENCOUNTER — Telehealth: Payer: Self-pay

## 2022-02-07 ENCOUNTER — Other Ambulatory Visit (HOSPITAL_COMMUNITY): Payer: Self-pay

## 2022-02-07 NOTE — Telephone Encounter (Unsigned)
Received [facility] communication advair hfa 320/41mch oral inh 120s  drug not covered  alternative is wixelinhubaer, or  breoelliptainh please advice pharmacy number [phone]

## 2022-02-07 NOTE — Telephone Encounter (Signed)
Patient Advocate Encounter   Received notification from [place] that prior authorization is required for Advair HFA 230-21MCG/ACT aerosol  Submitted: 02-07-2022 Key [id number]   Please note that insurance preferred alternatives include: fluticasone/salmeterol, wixela, and breo ellipta   Status is pending

## 2022-02-10 MED ORDER — FLUTICASONE FUROATE-VILANTEROL 200-25 MCG/ACT IN AEPB: 1.0000 | INHALATION_SPRAY | Freq: Every day | RESPIRATORY_TRACT | 5 refills | Status: DC

## 2022-02-10 NOTE — Telephone Encounter (Signed)
Pa denied for advair breo and trelegy preferred

## 2022-02-10 NOTE — Telephone Encounter (Signed)
Patient Advocate Encounter  Received a fax from [facility] regarding Prior Authorization for Advair HFA 230-21MCG/ACT aerosol.   Authorization has been DENIED due to Drug Not Covered/Plan Exclusion - May Be Covered Under Medical- Your request for coverage was denied because your prescription benefit plan does not cover the requested medications; may be covered under medical plan. This decision relates specifically to coverage provided under your prescription benefit plan and dos not involve any determination of medical judgment.  Determination letter attached to patient chart

## 2022-02-10 NOTE — Telephone Encounter (Signed)
Breo 260mcg 1 puff daily sent in to replace Advair HFA as that is not covered.

## 2022-07-14 ENCOUNTER — Other Ambulatory Visit: Payer: Self-pay

## 2022-07-24 ENCOUNTER — Telehealth: Payer: Self-pay

## 2022-07-24 ENCOUNTER — Encounter: Payer: Self-pay | Admitting: Internal Medicine

## 2022-07-24 ENCOUNTER — Other Ambulatory Visit: Payer: Self-pay

## 2022-07-24 ENCOUNTER — Ambulatory Visit (INDEPENDENT_AMBULATORY_CARE_PROVIDER_SITE_OTHER): Payer: BC Managed Care – PPO | Admitting: Internal Medicine

## 2022-07-24 VITALS — BP 132/88 | HR 85 | Temp 98.1°F | Resp 20 | Ht 63.0 in | Wt 363.8 lb

## 2022-07-24 DIAGNOSIS — H1013 Acute atopic conjunctivitis, bilateral: Secondary | ICD-10-CM | POA: Diagnosis not present

## 2022-07-24 DIAGNOSIS — J4540 Moderate persistent asthma, uncomplicated: Secondary | ICD-10-CM | POA: Diagnosis not present

## 2022-07-24 DIAGNOSIS — J3089 Other allergic rhinitis: Secondary | ICD-10-CM | POA: Diagnosis not present

## 2022-07-24 DIAGNOSIS — J302 Other seasonal allergic rhinitis: Secondary | ICD-10-CM | POA: Diagnosis not present

## 2022-07-24 MED ORDER — AZELASTINE HCL 0.1 % NA SOLN: 2.0000 | Freq: Two times a day (BID) | NASAL | 5 refills | Status: DC | PRN

## 2022-07-24 MED ORDER — AZELASTINE HCL 0.05 % OP SOLN: 1.0000 [drp] | Freq: Two times a day (BID) | OPHTHALMIC | 5 refills | Status: AC | PRN

## 2022-07-24 MED ORDER — ALBUTEROL SULFATE HFA 108 (90 BASE) MCG/ACT IN AERS: 2.0000 | INHALATION_SPRAY | Freq: Four times a day (QID) | RESPIRATORY_TRACT | 1 refills | Status: DC | PRN

## 2022-07-24 MED ORDER — FLUTICASONE-SALMETEROL 230-21 MCG/ACT IN AERO: 2.0000 | INHALATION_SPRAY | Freq: Two times a day (BID) | RESPIRATORY_TRACT | 5 refills | Status: DC

## 2022-07-24 MED ORDER — CARBINOXAMINE MALEATE 4 MG PO TABS: 4.0000 mg | ORAL_TABLET | Freq: Two times a day (BID) | ORAL | 5 refills | Status: DC | PRN

## 2022-07-24 MED ORDER — FLUTICASONE-SALMETEROL 250-50 MCG/ACT IN AEPB: 1.0000 | INHALATION_SPRAY | Freq: Two times a day (BID) | RESPIRATORY_TRACT | 5 refills | Status: DC

## 2022-07-24 MED ORDER — SPIRIVA RESPIMAT 1.25 MCG/ACT IN AERS: INHALATION_SPRAY | RESPIRATORY_TRACT | 5 refills | Status: DC

## 2022-07-24 NOTE — Patient Instructions (Addendum)
Asthma: - Maintenance inhaler: Advair 2 puffs twice daily and Spiriva 2 puffs daily - Rescue inhaler: Albuterol 2 puffs via spacer or 1 vial via nebulizer every 4-6 hours as needed for respiratory symptoms of cough, shortness of breath, or wheezing  Rhinitis: - Avoidance measures discussed. - Use nasal saline rinses before nose sprays such as with Neilmed Sinus Rinse.  Use distilled water.   - Use Azelastine 1-2 sprays each nostril twice daily as needed. Aim upward and outward. - Use Carbinoxamine 4mg  twice daily as needed. - Consider allergy shots as long term control of your symptoms by teaching your immune system to be more tolerant of your allergy triggers  Follow up : 6 months, sooner if needed It was a pleasure meeting you in clinic today! Thank you for allowing me to participate in your care.  Tonny Bollman, MD [facility]

## 2022-07-24 NOTE — Telephone Encounter (Signed)
Patient informed that we received fax communication regarding fluticasone/ salm inh 230/38mcg 120s not being coverd by insurance. Changed to wixela 250/39mcg 1 puff bid . She understand and has no questions or concerns. She was also grateful for update and will pick up as soon as [facility] updates her.

## 2022-07-24 NOTE — Progress Notes (Signed)
FOLLOW UP Date of Service/Encounter:  07/24/22   Subjective:  Deborah Owen (DOB: 1985/06/11) is a 37 y.o. female who [facility] in re-evaluation of the following: asthma, allergic rhinitis History obtained from: chart review and patient and partner .  For Review, LV was on 10/30/21  with Dr. Allena Katz seen for routine follow-up. See below for summary of history and diagnostics.  Therapeutic plans/changes recommended: doing well. Continued on advair 230 mcg 2 puffs BID + spiriva 2 puffs daily. Continued on carbinoxamine and azelastine nasal spray as needed.  ----------------------------------------------------- Pertinent History/Diagnostics:  Asthma: rarely has SOB/wheezing/coughing except when she does not take her medications. She does have some exertional symptoms so sometimes pretreats with the albuterol. She is on Advair 230 2 puffs BID and Spiriva 2 puffs daily.  Breztri caused increased congestion.  Allergic Rhinitis:  Has tried second-generation antihistamines in the past without much improvement. Carbinoxamine is helpful. Azelastine has been helpful --------------------------------------------------- Today presents for follow-up. Asthma:  She is using infrequently her albuterol. Will pretreat occasionally when doing exercise. She is trying to be more active.  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 She has been keeping up and this is great achievement. She is on Advair 230 mcg 2 puffs twice daily and spiriva 2 puffs daily. Allergic rhinitis: She is using carbinoxamine and azelastine nasal spray when she is having symptoms.  She uses carbinoxamine 3-4 times per week and azelastine 1-2 times per week. When she does use azelastine, it is very effective for her. It tends ot clear her mucus. She does have fears of overusing nasal sprays as her grandmother became addicted to one.    Allergies as of 07/24/2022   No Known  Allergies      Medication List        Accurate as of July 24, 2022  4:04 PM. If you have any questions, ask your nurse or doctor.          albuterol 108 (90 Base) MCG/ACT inhaler Commonly known as: VENTOLIN HFA Inhale 2 puffs into the lungs every 6 (six) hours as needed for wheezing or shortness of breath.   azelastine 0.1 % nasal spray Commonly known as: ASTELIN Place 2 sprays into both nostrils 2 (two) times daily.   Carbinoxamine Maleate 4 MG Tabs Take 1 tablet (4 mg total) by mouth 2 (two) times daily as needed (for allergies).   fluticasone furoate-vilanterol 200-25 MCG/ACT Aepb Commonly known as: Breo Ellipta Inhale 1 puff into the lungs daily.   fluticasone-salmeterol 230-21 MCG/ACT inhaler Commonly known as: ADVAIR HFA Inhale 2 puffs into the lungs 2 (two) times daily.   Olopatadine HCl 0.2 % Soln Commonly known as: Pataday Place 1 drop into both eyes daily as needed.   Spiriva Respimat 1.25 MCG/ACT Aers Generic drug: Tiotropium Bromide Monohydrate 2 puff once a day.       Past Medical History:  Diagnosis Date   Asthma    No past surgical history on file. Otherwise, there have been no changes to her past medical history, surgical history, family history, or social history.  ROS: All others negative except as noted per HPI.   Objective:  BP 132/88 (BP Location: Left Arm, Patient Position: Sitting, Cuff Size: Normal)   Pulse 85   Temp 98.1 F (36.7 C) (Temporal)   Resp 20   Ht 5\' 3"  (1.6 m)   Wt (!) 363 lb 12.8 oz (165 kg)  SpO2 95%   BMI 64.44 kg/m  Body mass index is 64.44 kg/m. Physical Exam: General Appearance:  Alert, cooperative, no distress, appears stated age  Head:  Normocephalic, without obvious abnormality, atraumatic  Eyes:  Conjunctiva clear, EOM's intact  Nose: Nares normal, hypertrophic turbinates, normal mucosa, and no visible anterior polyps  Throat: Lips, tongue normal; teeth and gums normal, normal posterior oropharynx   Neck: Supple, symmetrical  Lungs:   clear to auscultation bilaterally, Respirations unlabored, no coughing  Heart:  regular rate and rhythm and no murmur, Appears well perfused  Extremities: No edema  Skin: Skin color, texture, turgor normal and no rashes or lesions on visualized portions of skin  Neurologic: No gross deficits  Spirometry:  Tracings reviewed. Her effort: Good reproducible efforts. FVC: 3.19L FEV1: 2.71L, 90% predicted FEV1/FVC ratio: 101% Interpretation: Spirometry consistent with normal pattern.  Please see scanned spirometry results for details.  Assessment/Plan   Asthma: controlled, moderate persistent - Maintenance inhaler: Advair 2 puffs twice daily and Spiriva 2 puffs daily - Rescue inhaler: Albuterol 2 puffs via spacer or 1 vial via nebulizer every 4-6 hours as needed for respiratory symptoms of cough, shortness of breath, or wheezing  Rhinitis:controlled - Avoidance measures discussed. - Use nasal saline rinses before nose sprays such as with Neilmed Sinus Rinse.  Use distilled water.   - Use Azelastine 1-2 sprays each nostril twice daily as needed. Aim upward and outward. - Use Carbinoxamine 4mg  twice daily as needed. - Consider allergy shots as long term control of your symptoms by teaching your immune system to be more tolerant of your allergy triggers  Follow up : 6 months, sooner if needed It was a pleasure meeting you in clinic today! Thank you for allowing me to participate in your care.  Tonny Bollman, MD  [facility]

## 2022-09-17 IMAGING — DX DG CHEST 2V
2 series · 2 of 2 positions shown · non-contrast
Comparison: None.

CLINICAL DATA: Cough and epigastric pain.

EXAM:
CHEST - 2 VIEW

[chest pa]
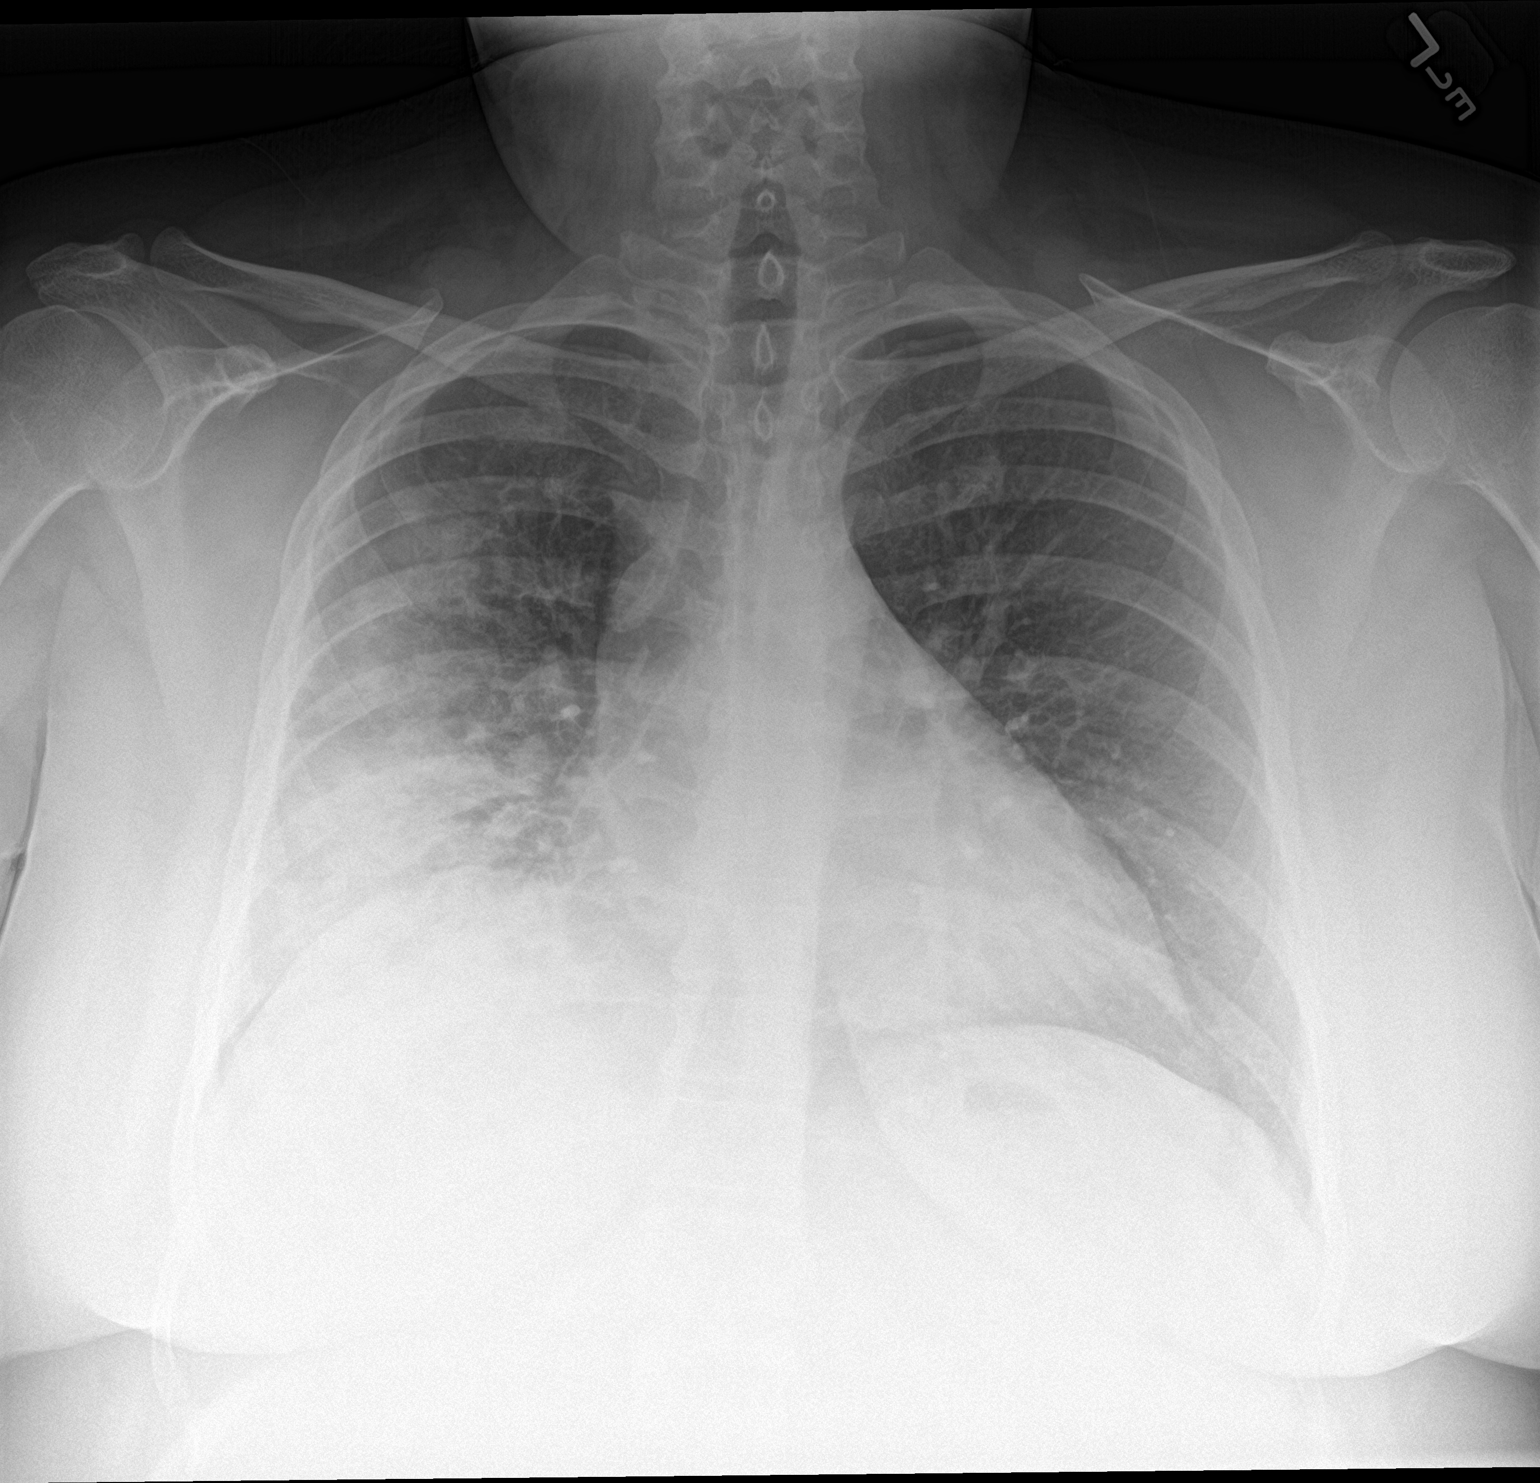

[chest lat]
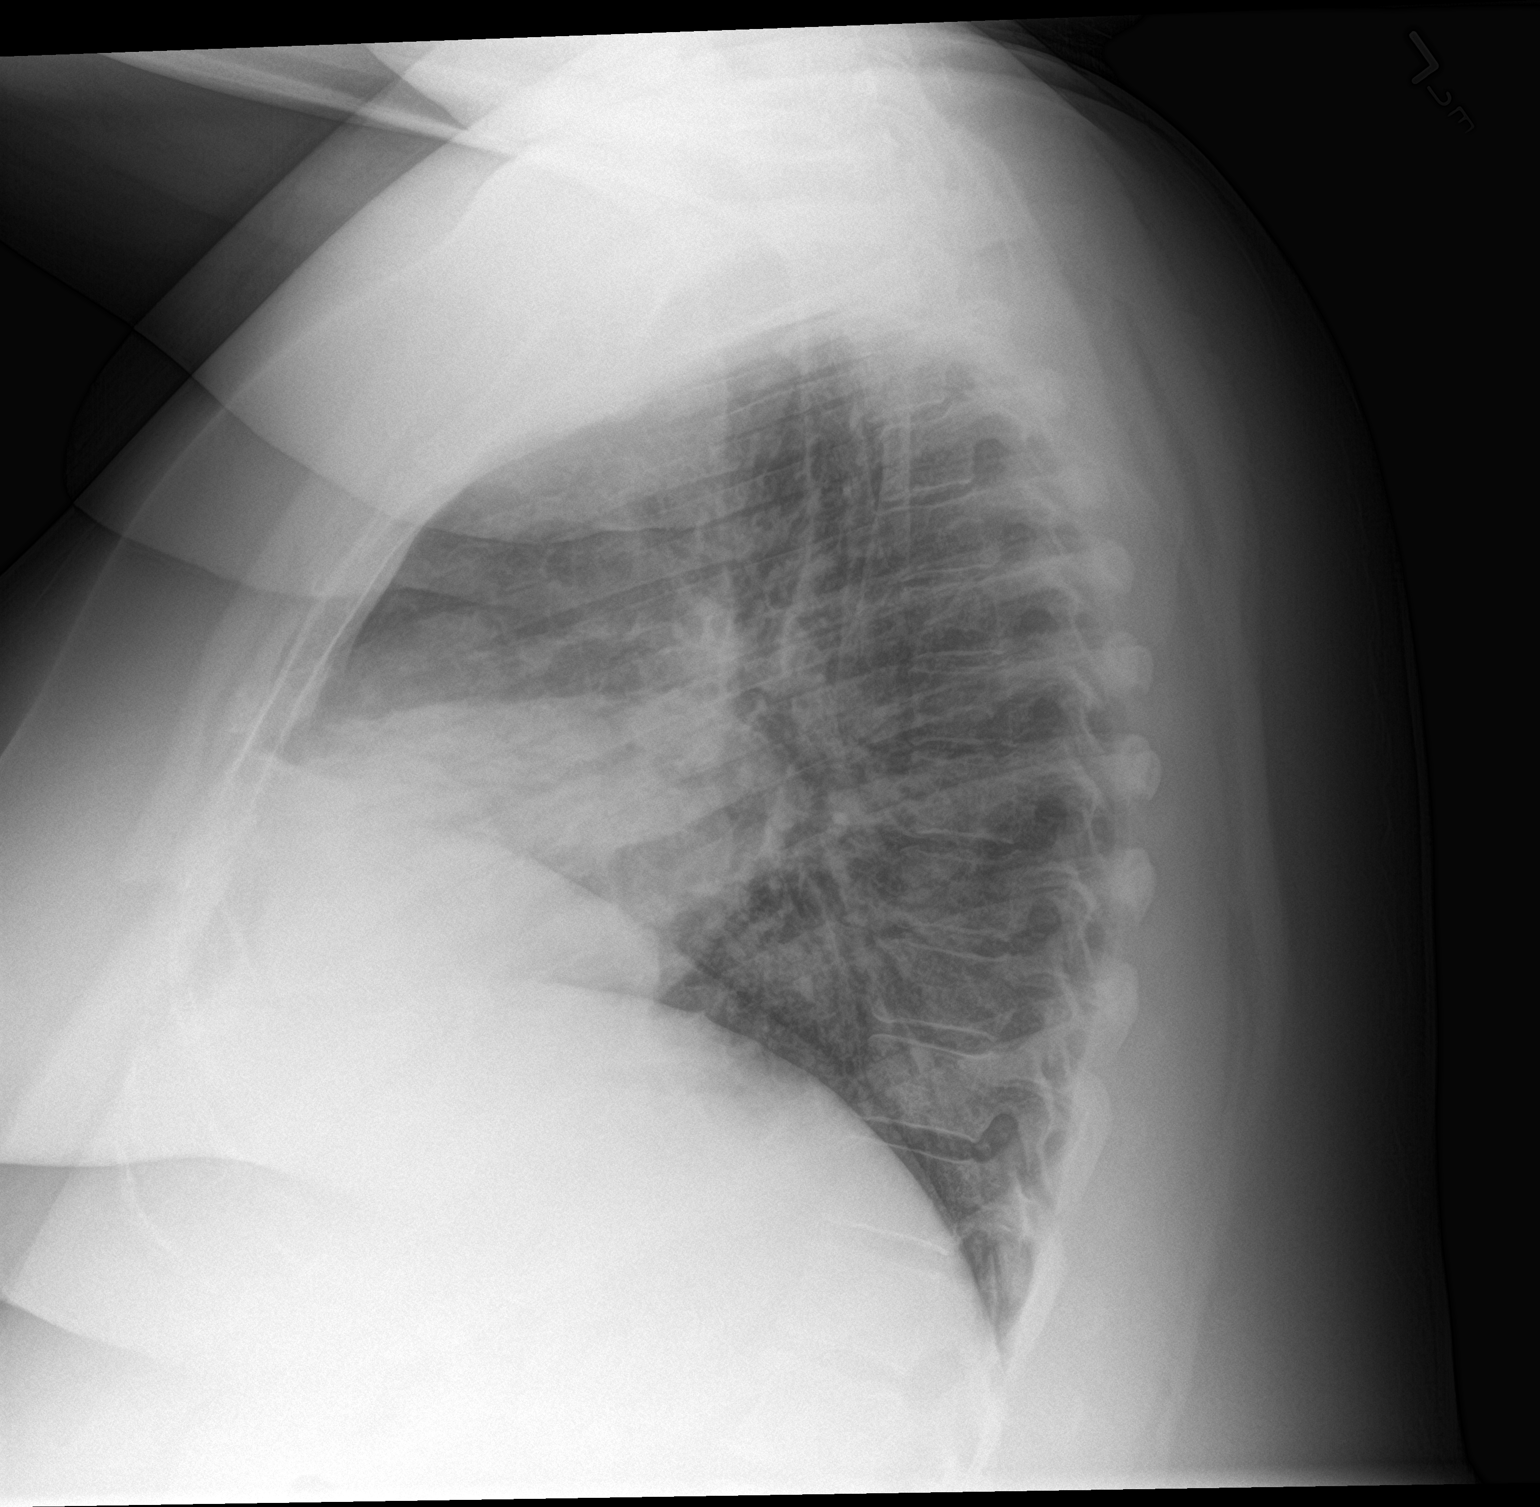

[2 of 2 positions shown; findings below may reference images not displayed]

FINDINGS: Moderate to marked severity infiltrate is seen within the right
middle lobe. There is no evidence of a pleural effusion or
pneumothorax. The heart size and mediastinal contours are within
normal limits. The visualized skeletal structures are unremarkable.
IMPRESSION: Moderate to marked severity right middle lobe infiltrate.

## 2022-09-27 ENCOUNTER — Other Ambulatory Visit: Payer: Self-pay | Admitting: Internal Medicine

## 2023-03-24 ENCOUNTER — Other Ambulatory Visit: Payer: Self-pay | Admitting: Internal Medicine

## 2023-03-26 ENCOUNTER — Other Ambulatory Visit: Payer: Self-pay | Admitting: Internal Medicine

## 2023-04-17 ENCOUNTER — Telehealth: Payer: Self-pay | Admitting: Internal Medicine

## 2023-04-17 NOTE — Telephone Encounter (Signed)
 Pt needs to be seen in clinic before any more refills are given pt informed and scheduled for 03/21 at 4pm with c dale fnp

## 2023-04-17 NOTE — Telephone Encounter (Signed)
 Pt called to get a refill on  Spiriva Respimat Tiotropium Bromide Monohydrate (SPIRIVA RESPIMAT) 1.25 MCG/ACT AERS

## 2023-04-23 ENCOUNTER — Ambulatory Visit: Admitting: Family

## 2023-04-23 NOTE — Patient Instructions (Incomplete)
 Asthma: - Maintenance inhaler: Wixela 250/50 1 inhalation twice daily. Rinse mouth out after and Spiriva 2 puffs daily - Rescue inhaler: Albuterol 2 puffs via spacer or 1 vial via nebulizer every 4-6 hours as needed for respiratory symptoms of cough, shortness of breath, or wheezing  Rhinitis: - Avoidance measures discussed. - Use nasal saline rinses before nose sprays such as with Neilmed Sinus Rinse.  Use distilled water.   - Use Azelastine 1-2 sprays each nostril twice daily as needed. Aim upward and outward. - Use Carbinoxamine 4mg  twice daily as needed. - Consider allergy shots as long term control of your symptoms by teaching your immune system to be more tolerant of your allergy triggers  Follow up :  months, sooner if needed

## 2023-04-24 ENCOUNTER — Ambulatory Visit: Admitting: Family

## 2023-04-24 ENCOUNTER — Encounter: Payer: Self-pay | Admitting: Family

## 2023-04-24 VITALS — BP 126/80 | HR 80 | Temp 98.6°F | Resp 20 | Ht 63.0 in | Wt 376.9 lb

## 2023-04-24 DIAGNOSIS — J4540 Moderate persistent asthma, uncomplicated: Secondary | ICD-10-CM

## 2023-04-24 DIAGNOSIS — J3089 Other allergic rhinitis: Secondary | ICD-10-CM | POA: Diagnosis not present

## 2023-04-24 DIAGNOSIS — J302 Other seasonal allergic rhinitis: Secondary | ICD-10-CM

## 2023-04-24 MED ORDER — ALBUTEROL SULFATE HFA 108 (90 BASE) MCG/ACT IN AERS: INHALATION_SPRAY | RESPIRATORY_TRACT | 1 refills | Status: DC

## 2023-04-24 MED ORDER — CARBINOXAMINE MALEATE 4 MG PO TABS: 4.0000 mg | ORAL_TABLET | Freq: Two times a day (BID) | ORAL | 5 refills | Status: AC | PRN

## 2023-04-24 MED ORDER — AZELASTINE HCL 0.1 % NA SOLN
2.0000 | Freq: Two times a day (BID) | NASAL | 5 refills | Status: AC | PRN
Start: 2023-04-24 — End: ?

## 2023-04-24 MED ORDER — SPIRIVA RESPIMAT 1.25 MCG/ACT IN AERS: INHALATION_SPRAY | RESPIRATORY_TRACT | 5 refills | Status: DC

## 2023-04-24 MED ORDER — FLUTICASONE-SALMETEROL 250-50 MCG/ACT IN AEPB: 1.0000 | INHALATION_SPRAY | Freq: Two times a day (BID) | RESPIRATORY_TRACT | 5 refills | Status: DC

## 2023-04-24 NOTE — Progress Notes (Signed)
 [address] [place] [address] Dept: [phone]  FOLLOW UP NOTE  Patient ID: Deborah Owen, female    DOB: 1985/11/18  Age: 38 y.o. MRN: [id number] Date of Office Visit: 04/24/2023  Assessment  Chief Complaint: Follow-up and Medication Refill (Med refills 2 asthma episodes)  HPI Deborah Owen is a 38 year old female who presents today for follow-up of controlled moderate persistent asthma and rhinitis controlled.  She was last seen on July 24, 2022 by Dr. Maurine Minister.  Her spouse is here with her today and provides history.  She reports somewhere around December or January she had a light case of pneumonia and was given antibiotic and probiotics.  Moderate persistent asthma: She reports that there are times where she forgets to take a dose of her Wixela 250/5o0 mcg 1 puff twice a day.  For the first 2 months of starting Wixela she was only taking it once a day before she realized she was supposed to be taking it twice a day.  She continues to take Spiriva 2 puffs once a day.  She feels like this medication is the most helpful.  She reports coughing due to postnasal drip.  She also will sometimes cough when she laughs a long time.  She will wheeze only if she forgets to take her medicine.  She denies tightness in chest and nocturnal awakenings due to breathing problems.  She reports shortness of breath with major activity or being anxious.  Since her last office visit she has not required any systemic steroids or made any trips to the emergency room or urgent care due to breathing problems.  She reports that in February she was at a  show for work and a pipe burst occurred over her head and caused her to have an asthma attack and become anxious.  She calmed down by talking through it, using her inhaler once and resting.  She does report the rescue inhaler helped and she only had to use it once.  She uses her albuterol before strenuous activity and then maybe once a week.  She recently moved into a  new apartment and the guy below smokes.  When she is near the guy who smokes it does bother her asthma.  She just tries to not go outside when he is out smoking.  Rhinitis: Her skin test in 2019 was positive to grass pollen, cat, dog, and cockroach.  She reports that she lives with a cat.  She reports rhinorrhea that drips and causes her to blow her nose frequently.  She also has postnasal drip.  She only has nasal congestion when she is sick.  She mentions that she has not needed azelastine nasal spray.  Her husband reports that she does not like it, but it works when she uses it.  She is also taking carbinoxamine as needed.  She had thought about doing allergy injections, but is not able to to do this due to her travel with work.   Drug Allergies:  No Known Allergies  Review of Systems: Negative except as per HPI   Physical Exam: BP 126/80   Pulse 80   Temp 98.6 F (37 C) (Temporal)   Resp 20   Ht 5\' 3"  (1.6 m)   Wt (!) 376 lb 14.4 oz (171 kg)   SpO2 98%   BMI 66.76 kg/m    Physical Exam Exam conducted with a chaperone present (Spouse present).  Constitutional:      Appearance: Normal appearance.  HENT:  Head: Normocephalic and atraumatic.     Comments: Pharynx normal, eyes normal, ears normal, nose normal    Right Ear: Tympanic membrane, ear canal and external ear normal.     Left Ear: Tympanic membrane, ear canal and external ear normal.     Nose: Nose normal.     Mouth/Throat:     Mouth: Mucous membranes are moist.     Pharynx: Oropharynx is clear.  Eyes:     Conjunctiva/sclera: Conjunctivae normal.  Cardiovascular:     Rate and Rhythm: Regular rhythm.     Heart sounds: Normal heart sounds.  Pulmonary:     Effort: Pulmonary effort is normal.     Breath sounds: Normal breath sounds.     Comments: Lungs clear to auscultation Musculoskeletal:     Cervical back: Neck supple.  Skin:    General: Skin is warm.  Neurological:     Mental Status: She is alert and  oriented to person, place, and time.  Psychiatric:        Mood and Affect: Mood normal.        Behavior: Behavior normal.        Thought Content: Thought content normal.        Judgment: Judgment normal.     Diagnostics: FVC 3.29 L (91%), FEV1 2.73 L (91%), FEV1/FVC 0.83.  Predicted FVC 3.61 L, predicted FEV1 3.00 L.  Spirometry indicates normal respiratory function.  Assessment and Plan: 1. Seasonal and perennial allergic rhinitis   2. Moderate persistent asthma without complication     Meds ordered this encounter  Medications   fluticasone-salmeterol (WIXELA INHUB) 250-50 MCG/ACT AEPB    Sig: Inhale 1 puff into the lungs in the morning and at bedtime.    Dispense:  60 each    Refill:  5   Tiotropium Bromide Monohydrate (SPIRIVA RESPIMAT) 1.25 MCG/ACT AERS    Sig: 2 puff once a day.    Dispense:  1 each    Refill:  5   azelastine (ASTELIN) 0.1 % nasal spray    Sig: Place 2 sprays into both nostrils 2 (two) times daily as needed for rhinitis.    Dispense:  30 mL    Refill:  5   Carbinoxamine Maleate 4 MG TABS    Sig: Take 1 tablet (4 mg total) by mouth 2 (two) times daily as needed (for allergies).    Dispense:  60 tablet    Refill:  5   albuterol (VENTOLIN HFA) 108 (90 Base) MCG/ACT inhaler    Sig: Inhale 2 puffs every 4-6 hours as needed for cough, wheeze, tightness in chest, or shortness of breath.    Dispense:  6.7 g    Refill:  1    Patient Instructions  Asthma: Moderately controlled - Maintenance inhaler: Wixela 250/50 1 inhalation twice daily. Rinse mouth out after and Spiriva 2 puffs daily - Rescue inhaler: Albuterol 2 puffs via spacer or 1 vial via nebulizer every 4-6 hours as needed for respiratory symptoms of cough, shortness of breath, or wheezing  Rhinitis: - Avoidance measures discussed. - Use nasal saline rinses before nose sprays such as with Neilmed Sinus Rinse.  Use distilled water.   - Use Azelastine 1-2 sprays each nostril twice daily as needed.  Aim upward and outward.  You can try using these more consistently to help with the drainage down your throat. - Use Carbinoxamine 4mg  twice daily as needed.  Try using this more consistently to see if this helps with your drainage -  Consider allergy shots as long term control of your symptoms by teaching your immune system to be more tolerant of your allergy triggers  Follow up :6  months, sooner if needed    Return in about 6 months (around 10/25/2023), or if symptoms worsen or fail to improve.    Thank you for the opportunity to care for this patient.  Please do not hesitate to contact me with questions.  Nehemiah Settle, FNP [facility]

## 2023-06-21 ENCOUNTER — Other Ambulatory Visit: Payer: Self-pay | Admitting: Family

## 2023-11-02 ENCOUNTER — Ambulatory Visit: Admitting: Family

## 2023-11-05 ENCOUNTER — Ambulatory Visit: Admitting: Family

## 2023-11-26 ENCOUNTER — Ambulatory Visit: Admitting: Family

## 2023-12-01 ENCOUNTER — Telehealth: Payer: Self-pay | Admitting: Family

## 2023-12-01 NOTE — Telephone Encounter (Signed)
 Pt called and wanted to know if she can get medication Tiotropium Bromide  Monohydrate (SPIRIVA  RESPIMAT) 1.25 MCG/ACT AERS  And requested a call back.

## 2023-12-02 NOTE — Patient Instructions (Incomplete)
 Asthma: - Maintenance inhaler: Wixela 250/50 1 inhalation twice daily. Rinse mouth out after and Spiriva  2 puffs daily - Rescue inhaler: Albuterol  2 puffs via spacer or 1 vial via nebulizer every 4-6 hours as needed for respiratory symptoms of cough, shortness of breath, or wheezing  Asthma control goals:  Full participation in all desired activities (may need albuterol  before activity) Albuterol  use two time or less a week on average (not counting use with activity) Cough interfering with sleep two time or less a month Oral steroids no more than once a year No hospitalizations   Allergic rhinitis: - Avoidance measures discussed. - Use nasal saline rinses before nose sprays such as with Neilmed Sinus Rinse.  Use distilled water.   - Use Azelastine  1-2 sprays each nostril twice daily as needed. Aim upward and outward.  You can try using these more consistently to help with the drainage down your throat. - Use Carbinoxamine  4mg  twice daily as needed.  Try using this more consistently to see if this helps with your drainage - Consider allergy  shots as long term control of your symptoms by teaching your immune system to be more tolerant of your allergy  triggers  Follow up :  months, sooner if needed

## 2023-12-03 ENCOUNTER — Ambulatory Visit: Admitting: Family

## 2023-12-03 ENCOUNTER — Other Ambulatory Visit: Payer: Self-pay

## 2023-12-03 MED ORDER — SPIRIVA RESPIMAT 1.25 MCG/ACT IN AERS: 1.2500 ug | INHALATION_SPRAY | Freq: Every day | RESPIRATORY_TRACT | 0 refills | Status: DC

## 2023-12-03 NOTE — Telephone Encounter (Signed)
 Refill sent, spoke with pt, appt already schedule

## 2023-12-10 ENCOUNTER — Other Ambulatory Visit: Payer: Self-pay | Admitting: Family

## 2024-01-08 ENCOUNTER — Other Ambulatory Visit: Payer: Self-pay | Admitting: Family

## 2024-01-08 DIAGNOSIS — J4540 Moderate persistent asthma, uncomplicated: Secondary | ICD-10-CM

## 2024-02-03 NOTE — Patient Instructions (Incomplete)
 Asthma: - Maintenance inhaler: Wixela 250/50 1 inhalation twice daily. Rinse mouth out after and Spiriva  2 puffs daily - Rescue inhaler: Albuterol  2 puffs via spacer or 1 vial via nebulizer every 4-6 hours as needed for respiratory symptoms of cough, shortness of breath, or wheezing  Allergic rhinitis: - Avoidance measures discussed. - Use nasal saline rinses before nose sprays such as with Neilmed Sinus Rinse.  Use distilled water.   - Use Azelastine  1-2 sprays each nostril twice daily as needed. Aim upward and outward.  You can try using these more consistently to help with the drainage down your throat. - Use Carbinoxamine  4mg  twice daily as needed.  - Consider allergy  shots as long term control of your symptoms by teaching your immune system to be more tolerant of your allergy  triggers  Follow up :6  months, sooner if needed

## 2024-02-05 ENCOUNTER — Ambulatory Visit: Admitting: Family

## 2024-02-05 DIAGNOSIS — J302 Other seasonal allergic rhinitis: Secondary | ICD-10-CM
